# Patient Record
Sex: Male | Born: 1975 | Hispanic: Yes | Marital: Married | Smoking: Never smoker
Health system: Southern US, Community
[De-identification: ages and names within clinical notes are randomized; demographics above are authoritative.]

---

## 2020-10-05 ENCOUNTER — Encounter (HOSPITAL_COMMUNITY): Payer: Self-pay | Admitting: Emergency Medicine

## 2020-10-05 ENCOUNTER — Emergency Department (HOSPITAL_COMMUNITY): Payer: Self-pay

## 2020-10-05 ENCOUNTER — Other Ambulatory Visit: Payer: Self-pay

## 2020-10-05 ENCOUNTER — Inpatient Hospital Stay (HOSPITAL_COMMUNITY)
Admission: EM | Admit: 2020-10-05 | Discharge: 2020-10-07 | DRG: 853 | Disposition: A | Discharge status: Home / Self Care | Payer: Self-pay | Attending: General Surgery | Admitting: General Surgery

## 2020-10-05 DIAGNOSIS — E861 Hypovolemia: Secondary | ICD-10-CM | POA: Diagnosis present

## 2020-10-05 DIAGNOSIS — E871 Hypo-osmolality and hyponatremia: Secondary | ICD-10-CM | POA: Diagnosis present

## 2020-10-05 DIAGNOSIS — K3532 Acute appendicitis with perforation and localized peritonitis, without abscess: Secondary | ICD-10-CM | POA: Diagnosis present

## 2020-10-05 DIAGNOSIS — Z833 Family history of diabetes mellitus: Secondary | ICD-10-CM

## 2020-10-05 DIAGNOSIS — R7989 Other specified abnormal findings of blood chemistry: Secondary | ICD-10-CM

## 2020-10-05 DIAGNOSIS — Z20822 Contact with and (suspected) exposure to covid-19: Secondary | ICD-10-CM | POA: Diagnosis present

## 2020-10-05 DIAGNOSIS — K3580 Unspecified acute appendicitis: Secondary | ICD-10-CM | POA: Diagnosis present

## 2020-10-05 DIAGNOSIS — E0500 Thyrotoxicosis with diffuse goiter without thyrotoxic crisis or storm: Secondary | ICD-10-CM | POA: Diagnosis present

## 2020-10-05 DIAGNOSIS — E876 Hypokalemia: Secondary | ICD-10-CM | POA: Diagnosis present

## 2020-10-05 DIAGNOSIS — A419 Sepsis, unspecified organism: Principal | ICD-10-CM | POA: Diagnosis present

## 2020-10-05 LAB — COMPREHENSIVE METABOLIC PANEL
ALT: 33 U/L (ref 0–44)
AST: 30 U/L (ref 15–41)
Albumin: 4.5 g/dL (ref 3.5–5.0)
Alkaline Phosphatase: 163 U/L — ABNORMAL HIGH (ref 38–126)
Anion gap: 11 (ref 5–15)
BUN: 15 mg/dL (ref 6–20)
CO2: 25 mmol/L (ref 22–32)
Calcium: 9.1 mg/dL (ref 8.9–10.3)
Chloride: 98 mmol/L (ref 98–111)
Creatinine, Ser: 0.91 mg/dL (ref 0.61–1.24)
GFR, Estimated: >60 mL/min (ref >60)
Glucose, Bld: 152 mg/dL — ABNORMAL HIGH (ref 70–99)
Potassium: 3.4 mmol/L — ABNORMAL LOW (ref 3.5–5.1)
Sodium: 134 mmol/L — ABNORMAL LOW (ref 135–145)
Total Bilirubin: 1.8 mg/dL — ABNORMAL HIGH (ref 0.3–1.2)
Total Protein: 8.1 g/dL (ref 6.5–8.1)

## 2020-10-05 LAB — CBC WITH DIFFERENTIAL/PLATELET
Abs Immature Granulocytes: 0.07 10*3/uL (ref 0.00–0.07)
Basophils Absolute: 0.1 10*3/uL (ref 0.0–0.1)
Basophils Relative: 0 %
Eosinophils Absolute: 0 10*3/uL (ref 0.0–0.5)
Eosinophils Relative: 0 %
HCT: 47.4 % (ref 39.0–52.0)
Hemoglobin: 16.9 g/dL (ref 13.0–17.0)
Immature Granulocytes: 0 %
Lymphocytes Relative: 5 %
Lymphs Abs: 1 10*3/uL (ref 0.7–4.0)
MCH: 32.1 pg (ref 26.0–34.0)
MCHC: 35.7 g/dL (ref 30.0–36.0)
MCV: 89.9 fL (ref 80.0–100.0)
Monocytes Absolute: 1.1 10*3/uL — ABNORMAL HIGH (ref 0.1–1.0)
Monocytes Relative: 6 %
Neutro Abs: 17 10*3/uL — ABNORMAL HIGH (ref 1.7–7.7)
Neutrophils Relative %: 89 %
Platelets: 237 10*3/uL (ref 150–400)
RBC: 5.27 MIL/uL (ref 4.22–5.81)
RDW: 12.1 % (ref 11.5–15.5)
WBC: 19.4 10*3/uL — ABNORMAL HIGH (ref 4.0–10.5)
nRBC: 0 % (ref 0.0–0.2)

## 2020-10-05 LAB — RESP PANEL BY RT-PCR (FLU A&B, COVID) ARPGX2
Influenza A by PCR: NEGATIVE
Influenza B by PCR: NEGATIVE
SARS Coronavirus 2 by RT PCR: NEGATIVE

## 2020-10-05 LAB — LIPASE, BLOOD: Lipase: 28 U/L (ref 11–51)

## 2020-10-05 MED ORDER — ONDANSETRON HCL 4 MG/2ML IJ SOLN: 4.0000 mg | Freq: Four times a day (QID) | INTRAMUSCULAR | Status: DC | PRN

## 2020-10-05 MED ORDER — TRAZODONE HCL 50 MG PO TABS: 25.0000 mg | ORAL_TABLET | Freq: Every evening | ORAL | Status: DC | PRN

## 2020-10-05 MED ORDER — LORAZEPAM 2 MG/ML IJ SOLN: 1.0000 mg | INTRAMUSCULAR | Status: DC | PRN

## 2020-10-05 MED ORDER — ACETAMINOPHEN 325 MG PO TABS
650.0000 mg | ORAL_TABLET | Freq: Four times a day (QID) | ORAL | Status: DC | PRN
  Administered 2020-10-07: 650 mg via ORAL
  Filled 2020-10-05: qty 2

## 2020-10-05 MED ORDER — POTASSIUM CHLORIDE 20 MEQ PO PACK
40.0000 meq | PACK | Freq: Once | ORAL | Status: AC
  Administered 2020-10-05: 40 meq via ORAL
  Filled 2020-10-05: qty 2

## 2020-10-05 MED ORDER — HYDROMORPHONE HCL 1 MG/ML IJ SOLN
1.0000 mg | INTRAMUSCULAR | Status: DC | PRN
  Administered 2020-10-06 (×4): 1 mg via INTRAVENOUS
  Filled 2020-10-05 (×4): qty 1

## 2020-10-05 MED ORDER — ACETAMINOPHEN 325 MG PO TABS
650.0000 mg | ORAL_TABLET | Freq: Once | ORAL | Status: AC
  Administered 2020-10-05: 650 mg via ORAL
  Filled 2020-10-05: qty 2

## 2020-10-05 MED ORDER — ONDANSETRON HCL 4 MG/2ML IJ SOLN
4.0000 mg | Freq: Once | INTRAMUSCULAR | Status: AC
  Administered 2020-10-05: 4 mg via INTRAVENOUS
  Filled 2020-10-05: qty 2

## 2020-10-05 MED ORDER — SODIUM CHLORIDE 0.9 % IV BOLUS
1000.0000 mL | Freq: Once | INTRAVENOUS | Status: AC
  Administered 2020-10-05: 1000 mL via INTRAVENOUS

## 2020-10-05 MED ORDER — IOHEXOL 300 MG/ML  SOLN
100.0000 mL | Freq: Once | INTRAMUSCULAR | Status: AC | PRN
  Administered 2020-10-05: 100 mL via INTRAVENOUS

## 2020-10-05 MED ORDER — HYDROMORPHONE HCL 1 MG/ML IJ SOLN
1.0000 mg | Freq: Once | INTRAMUSCULAR | Status: AC
  Administered 2020-10-05: 1 mg via INTRAVENOUS
  Filled 2020-10-05: qty 1

## 2020-10-05 MED ORDER — ONDANSETRON HCL 4 MG PO TABS: 4.0000 mg | ORAL_TABLET | Freq: Four times a day (QID) | ORAL | Status: DC | PRN

## 2020-10-05 MED ORDER — MORPHINE SULFATE (PF) 2 MG/ML IV SOLN
2.0000 mg | INTRAVENOUS | Status: DC | PRN
  Administered 2020-10-05: 2 mg via INTRAVENOUS
  Filled 2020-10-05: qty 1

## 2020-10-05 MED ORDER — ACETAMINOPHEN 650 MG RE SUPP: 650.0000 mg | Freq: Four times a day (QID) | RECTAL | Status: DC | PRN

## 2020-10-05 MED ORDER — POTASSIUM CHLORIDE IN NACL 20-0.9 MEQ/L-% IV SOLN: INTRAVENOUS | Status: DC

## 2020-10-05 MED ORDER — MORPHINE SULFATE (PF) 4 MG/ML IV SOLN
4.0000 mg | Freq: Once | INTRAVENOUS | Status: AC
  Administered 2020-10-05: 4 mg via INTRAVENOUS
  Filled 2020-10-05: qty 1

## 2020-10-05 MED ORDER — PIPERACILLIN-TAZOBACTAM 3.375 G IVPB
3.3750 g | Freq: Three times a day (TID) | INTRAVENOUS | Status: DC
  Administered 2020-10-06 – 2020-10-07 (×3): 3.375 g via INTRAVENOUS
  Filled 2020-10-05 (×3): qty 50

## 2020-10-05 MED ORDER — ENOXAPARIN SODIUM 40 MG/0.4ML IJ SOSY
40.0000 mg | PREFILLED_SYRINGE | INTRAMUSCULAR | Status: DC
  Administered 2020-10-05 – 2020-10-06 (×2): 40 mg via SUBCUTANEOUS
  Filled 2020-10-05 (×2): qty 0.4

## 2020-10-05 MED ORDER — MAGNESIUM HYDROXIDE 400 MG/5ML PO SUSP: 30.0000 mL | Freq: Every day | ORAL | Status: DC | PRN

## 2020-10-05 MED ORDER — PIPERACILLIN-TAZOBACTAM 3.375 G IVPB 30 MIN: 3.3750 g | Freq: Four times a day (QID) | INTRAVENOUS | Status: DC

## 2020-10-05 MED ORDER — PIPERACILLIN-TAZOBACTAM 3.375 G IVPB 30 MIN
3.3750 g | Freq: Once | INTRAVENOUS | Status: AC
  Administered 2020-10-05: 3.375 g via INTRAVENOUS
  Filled 2020-10-05: qty 50

## 2020-10-05 NOTE — H&P (Signed)
[place]   PATIENT NAME: Omar [name]    MR#:  [id number]  DATE OF BIRTH:  [birth year]  DATE OF ADMISSION:  10/05/2020  PRIMARY CARE PHYSICIAN: Pcp, No   Patient is coming from: Home  REQUESTING/REFERRING PHYSICIAN: Kem Parkinson, PA-C  CHIEF COMPLAINT:   Chief Complaint  Patient presents with   Abdominal Pain   The patient is mainly Spanish-speaking.  His son was providing translation. HISTORY OF PRESENT ILLNESS:  Omar [name] is a 45 y.o. [name] with no significant medical history, presenting to the emergency room with acute onset of lower abdominal pain which started 2 days ago after eating beef taco.  He was seen in [facility] urgent care initially and was thought to have food poisoning.  He was given a prescription for Bentyl.  His pain has been worse today.  He had associated diaphoresis with it as well as nausea without vomiting.  His pain has been constant with no radiation.  This last bowel movement was yesterday morning and it was dark green but formed.  He denies any fever or chills at home however he was having low-grade fever here in the ER.  No bright red bleeding per rectum or melena.  He denies any dyspnea or cough or wheezing.  No chest pain or palpitations.  ED Course: Upon presentation to the emergency room temperature was 99.2 and later 100, blood pressure was 123/107 and later 137/93 with otherwise normal vital signs.  Labs revealed mild hypokalemia and hyponatremia and a glucose of 152.  Alk phos was 163 and total bili 1.8 with otherwise unremarkable LFTs.  CBC showed significant leukocytosis of 19.4 with neutrophilia.  Influenza antigens and COVID-19 PCR came back negative.  Imaging: Abdominal pelvic CT scan revealed the following: Acute appendicitis with possible perforation. Right lower quadrant pelvic free fluid with 2 areas of fluid which appear more organized than with expected in the right lower quadrant and pelvis,  possibly representing developing phlegmonous collections. No pneumoperitoneum.  Dr. Arnoldo Morale was notified about the patient and reviewed his abdominal CT scan pictures.  He believes that the patient can wait till the morning for operative intervention and recommended IV Zosyn.  The patient will be admitted to telemetry medical bed for further evaluation and management.  PAST MEDICAL HISTORY:  History reviewed. No pertinent past medical history.  He denies any medical problems.  PAST SURGICAL HISTORY:  History reviewed. No pertinent surgical history.  He denies any previous surgeries  SOCIAL HISTORY:   Social History   Tobacco Use   Smoking status: Unknown   Smokeless tobacco: Not on file  Substance Use Topics   Alcohol use: Not Currently  He drinks alcohol occasionally.  No history of tobacco use or illicit drug use.  FAMILY HISTORY:  Positive for diabetes mellitus in his mother.  DRUG ALLERGIES:  No Known Allergies  REVIEW OF SYSTEMS:   ROS As per history of present illness. All pertinent systems were reviewed above. Constitutional, HEENT, cardiovascular, respiratory, GI, GU, musculoskeletal, neuro, psychiatric, endocrine, integumentary and hematologic systems were reviewed and are otherwise negative/unremarkable except for positive findings mentioned above in the HPI.   MEDICATIONS AT HOME:   Prior to Admission medications   Not on File      VITAL SIGNS:  Blood pressure (!) 137/93, pulse 73, temperature 100 F (37.8 C), temperature source Oral, resp. rate 19, height $RemoveBe'5\' 9"'AtjhvGFmp$  (1.753 m), weight 81.6 kg, SpO2 97 %.  PHYSICAL EXAMINATION:  Physical Exam  GENERAL:  45 y.o.-year-old [name] patient lying in the bed with no acute distress.  EYES: Pupils equal, round, reactive to light and accommodation. No scleral icterus. Extraocular muscles intact.  HEENT: Head atraumatic, normocephalic. Oropharynx and nasopharynx clear.  NECK:  Supple, no jugular venous distention.  No thyroid enlargement, no tenderness.  LUNGS: Normal breath sounds bilaterally, no wheezing, rales,rhonchi or crepitation. No use of accessory muscles of respiration.  CARDIOVASCULAR: Regular rate and rhythm, heart rate was up to 92.  S1, S2 normal. No murmurs, rubs, or gallops.  ABDOMEN: Soft, nondistended, with right lower quadrant tenderness with rebound tenderness, mild guarding or rigidity.  He had positive peritoneal signs.  Bowel sounds present. No organomegaly or mass.  EXTREMITIES: No pedal edema, cyanosis, or clubbing.  NEUROLOGIC: Cranial nerves II through XII are intact. Muscle strength 5/5 in all extremities. Sensation intact. Gait not checked.  PSYCHIATRIC: The patient is alert and oriented x 3.  Normal affect and good eye contact. SKIN: No obvious rash, lesion, or ulcer.   LABORATORY PANEL:   CBC Recent Labs  Lab 10/05/20 1633  WBC 19.4*  HGB 16.9  HCT 47.4  PLT 237   ------------------------------------------------------------------------------------------------------------------  Chemistries  Recent Labs  Lab 10/05/20 1633  NA 134*  K 3.4*  CL 98  CO2 25  GLUCOSE 152*  BUN 15  CREATININE 0.91  CALCIUM 9.1  AST 30  ALT 33  ALKPHOS 163*  BILITOT 1.8*   ------------------------------------------------------------------------------------------------------------------  Cardiac Enzymes No results for input(s): TROPONINI in the last 168 hours. ------------------------------------------------------------------------------------------------------------------  RADIOLOGY:  CT ABDOMEN PELVIS W CONTRAST  Result Date: 10/05/2020 CLINICAL DATA:  Right lower quadrant abdominal pain and nausea x4 days. EXAM: CT ABDOMEN AND PELVIS WITH CONTRAST TECHNIQUE: Multidetector CT imaging of the abdomen and pelvis was performed using the standard protocol following bolus administration of intravenous contrast. CONTRAST:  OMNIPAQUE IOHEXOL 300 MG/ML  SOLN COMPARISON:   None. FINDINGS: Lower chest: No acute abnormality. Normal size heart. No significant pericardial effusion/thickening. Hepatobiliary: No suspicious hepatic lesion. Gallbladder is unremarkable. No biliary ductal dilation. Pancreas: Within normal limits. Spleen: Within normal limits. Adrenals/Urinary Tract: Adrenal glands are unremarkable. Kidneys are normal, without renal calculi, solid enhancing lesion, or hydronephrosis. Bladder is unremarkable for degree of distension. Stomach/Bowel: Small hiatal hernia otherwise stomach is grossly unremarkable. No pathologic dilation of small bowel. Descending and sigmoid colon are predominantly decompressed limiting evaluation. Small volume of formed stool in the ascending colon with gaseous distension of the transverse colon. Acute inflammation of a fluid-filled enlarged appendix with a 6 mm appendicoliths. Frayed appearance of the tip of the appendix with right lower quadrant free fluid. Vascular/Lymphatic: No abdominal aortic aneurysm. Prominent right lower quadrant lymph nodes without adenopathy by size criteria. No pathologically enlarged abdominal or pelvic lymph nodes. Reproductive: Mild prostatic enlargement. Other: Mesenteric edema with free fluid in the right lower quadrant and pelvis. There are 2 areas of fluid which appear more organized than with expected for normal free fluid for instance along the cecum measuring 3.4 cm on image 64/2 and along a loop of ileum in the right hemipelvis measuring 2.2 cm on image 75/2, possibly representing organizing collections. No pneumoperitoneum. Musculoskeletal: No acute osseous abnormality. IMPRESSION: Acute appendicitis with possible perforation. Right lower quadrant pelvic free fluid with 2 areas of fluid which appear more organized than with expected in the right lower quadrant and pelvis, possibly representing developing phlegmonous collections. No pneumoperitoneum. Electronically Signed   By: Maudry Mayhew MD   On:  10/05/2020 19:53      IMPRESSION AND PLAN:  Active Problems:   Acute appendicitis  1.  Acute appendicitis with suspected perforation and possible phlegmonous collections.  The patient had minimal increase in his heart rate to 92 and given his significant leukocytosis he would meet criteria for sepsis. - The patient will be admitted to a telemetry medical bed. - We will continue antibiotic therapy with IV Zosyn. - Pain management will be provided. - I discussed the case with Dr. Arnoldo Morale given the fact that is meeting sepsis criteria preferred to have the patient's evaluated tonight from surgical standpoint.  Dr. Arnoldo Morale will graciously assess the patient in the ER. - We will continue hydration with IV normal saline keep the patient NPO.  2.  Mild hypokalemia. - He was ordered p.o. potassium chloride with a sip of water and will replace it with a IV fluids. - We will check magnesium level.  3.  Mild hyponatremia. - This is likely secondary to hypovolemia. - The patient will be hydrated with IV normal saline with added potassium chloride and will follow his BMP.  4.  Elevated alk phos and total bili. - We will repeat LFTs with hydration.  DVT prophylaxis: Lovenox. Code Status: full code. Family Communication:  The plan of care was discussed in details with the patient (and family). I answered all questions. The patient agreed to proceed with the above mentioned plan. Further management will depend upon hospital course. Disposition Plan: Back to previous home environment Consults called: General surgery. All the records are reviewed and case discussed with ED provider.  Status is: Inpatient  Remains inpatient appropriate because:Ongoing active pain requiring inpatient pain management, Ongoing diagnostic testing needed not appropriate for outpatient work up, Unsafe d/c plan, IV treatments appropriate due to intensity of illness or inability to take PO, and Inpatient level of care  appropriate due to severity of illness  Dispo: The patient is from: Home              Anticipated d/c is to: Home              Patient currently is not medically stable to d/c.   Difficult to place patient No  TOTAL TIME TAKING CARE OF THIS PATIENT: 55 minutes.    Christel Mormon M.D on 10/05/2020 at 9:25 PM  [facility]   From 7 PM-7 AM, contact night-coverage [email or web address]  CC: Primary care physician; Pcp, No

## 2020-10-05 NOTE — Consult Note (Signed)
Reason for Consult: Acute appendicitis with probable perforation Referring Physician: Dr. Legrand Omar Omar [name] is an 45 y.o. male.  HPI: Patient is [name] who initially went to [facility] urgent care yesterday for abdominal pain.  He initially was diagnosed with food poisoning and started on Bentyl.  He presented today with worsening lower abdominal pain, nausea.  No vomiting was noted.  CT scan of the abdomen reveals acute appendicitis with phlegmon formation and organizing fluid in the right lower quadrant consistent with possible perforation.  No free air is noted.  He was noted to have a leukocytosis of 19,000.  He was admitted by the hospitalist for further evaluation and treatment.  Please see H&P.  History reviewed. No pertinent past medical history.  History reviewed. No pertinent surgical history.  History reviewed. No pertinent family history.  Social History:  reports previous alcohol use. He reports that he does not use drugs. No history on file for tobacco use.  Allergies: No Known Allergies  Medications: I have reviewed the patient's current medications.  Results for orders placed or performed during the hospital encounter of 10/05/20 (from the past 48 hour(s))  Resp Panel by RT-PCR (Flu A&B, Covid) Nasopharyngeal Swab     Status: None   Collection Time: 10/05/20  4:17 PM   Specimen: Nasopharyngeal Swab; Nasopharyngeal(NP) swabs in vial transport medium  Result Value Ref Range   SARS Coronavirus 2 by RT PCR NEGATIVE NEGATIVE    Comment: (NOTE) SARS-CoV-2 target nucleic acids are NOT DETECTED.  The SARS-CoV-2 RNA is generally detectable in upper respiratory specimens during the acute phase of infection. The lowest concentration of SARS-CoV-2 viral copies this assay can detect is 138 copies/mL. A negative result does not preclude SARS-Cov-2 infection and should not be used as the sole basis for treatment or other patient management decisions. A  negative result may occur with  improper specimen collection/handling, submission of specimen other than nasopharyngeal swab, presence of viral mutation(s) within the areas targeted by this assay, and inadequate number of viral copies(<138 copies/mL). A negative result must be combined with clinical observations, patient history, and epidemiological information. The expected result is Negative.  Fact Sheet for Patients:  BloggerCourse.com  Fact Sheet for Healthcare Providers:  SeriousBroker.it  This test is no t yet approved or cleared by the [place] FDA and  has been authorized for detection and/or diagnosis of SARS-CoV-2 by FDA under an Emergency Use Authorization (EUA). This EUA will remain  in effect (meaning this test can be used) for the duration of the COVID-19 declaration under Section 564(b)(1) of the Act, 21 U.S.C.section 360bbb-3(b)(1), unless the authorization is terminated  or revoked sooner.       Influenza A by PCR NEGATIVE NEGATIVE   Influenza B by PCR NEGATIVE NEGATIVE    Comment: (NOTE) The Xpert Xpress SARS-CoV-2/FLU/RSV plus assay is intended as an aid in the diagnosis of influenza from Nasopharyngeal swab specimens and should not be used as a sole basis for treatment. Nasal washings and aspirates are unacceptable for Xpert Xpress SARS-CoV-2/FLU/RSV testing.  Fact Sheet for Patients: BloggerCourse.com  Fact Sheet for Healthcare Providers: SeriousBroker.it  This test is not yet approved or cleared by the [place] FDA and has been authorized for detection and/or diagnosis of SARS-CoV-2 by FDA under an Emergency Use Authorization (EUA). This EUA will remain in effect (meaning this test can be used) for the duration of the COVID-19 declaration under Section 564(b)(1) of the Act, 21 U.S.C. section 360bbb-3(b)(1),  unless the authorization is terminated  or revoked.  Performed at [facility], [address], [place], [place] [address]   Lipase, blood     Status: None   Collection Time: 10/05/20  4:33 PM  Result Value Ref Range   Lipase 28 11 - 51 U/L    Comment: Performed at [facility], [address], [place], [place] [address]  Comprehensive metabolic panel     Status: Abnormal   Collection Time: 10/05/20  4:33 PM  Result Value Ref Range   Sodium 134 (L) 135 - 145 mmol/L   Potassium 3.4 (L) 3.5 - 5.1 mmol/L   Chloride 98 98 - 111 mmol/L   CO2 25 22 - 32 mmol/L   Glucose, Bld 152 (H) 70 - 99 mg/dL    Comment: Glucose reference range applies only to samples taken after fasting for at least 8 hours.   BUN 15 6 - 20 mg/dL   Creatinine, Ser 6.28 0.61 - 1.24 mg/dL   Calcium 9.1 8.9 - 31.5 mg/dL   Total Protein 8.1 6.5 - 8.1 g/dL   Albumin 4.5 3.5 - 5.0 g/dL   AST 30 15 - 41 U/L   ALT 33 0 - 44 U/L   Alkaline Phosphatase 163 (H) 38 - 126 U/L   Total Bilirubin 1.8 (H) 0.3 - 1.2 mg/dL   GFR, Estimated >17 >61 mL/min    Comment: (NOTE) Calculated using the CKD-EPI Creatinine Equation (2021)    Anion gap 11 5 - 15    Comment: Performed at [facility], [address], [place], [place] [address]  CBC with Differential     Status: Abnormal   Collection Time: 10/05/20  4:33 PM  Result Value Ref Range   WBC 19.4 (H) 4.0 - 10.5 K/uL   RBC 5.27 4.22 - 5.81 MIL/uL   Hemoglobin 16.9 13.0 - 17.0 g/dL   HCT 10.6 26.9 - 48.5 %   MCV 89.9 80.0 - 100.0 fL   MCH 32.1 26.0 - 34.0 pg   MCHC 35.7 30.0 - 36.0 g/dL   RDW 46.2 70.3 - 50.0 %   Platelets 237 150 - 400 K/uL   nRBC 0.0 0.0 - 0.2 %   Neutrophils Relative % 89 %   Neutro Abs 17.0 (H) 1.7 - 7.7 K/uL   Lymphocytes Relative 5 %   Lymphs Abs 1.0 0.7 - 4.0 K/uL   Monocytes Relative 6 %   Monocytes Absolute 1.1 (H) 0.1 - 1.0 K/uL   Eosinophils Relative 0 %   Eosinophils Absolute 0.0 0.0 - 0.5 K/uL   Basophils Relative 0 %   Basophils Absolute 0.1 0.0 - 0.1 K/uL   Immature  Granulocytes 0 %   Abs Immature Granulocytes 0.07 0.00 - 0.07 K/uL    Comment: Performed at [facility], [address] [place], [place] [address]    CT ABDOMEN PELVIS W CONTRAST  Result Date: 10/05/2020 CLINICAL DATA:  Right lower quadrant abdominal pain and nausea x4 days. EXAM: CT ABDOMEN AND PELVIS WITH CONTRAST TECHNIQUE: Multidetector CT imaging of the abdomen and pelvis was performed using the standard protocol following bolus administration of intravenous contrast. CONTRAST:  OMNIPAQUE IOHEXOL 300 MG/ML  SOLN COMPARISON:  None. FINDINGS: Lower chest: No acute abnormality. Normal size heart. No significant pericardial effusion/thickening. Hepatobiliary: No suspicious hepatic lesion. Gallbladder is unremarkable. No biliary ductal dilation. Pancreas: Within normal limits. Spleen: Within normal limits. Adrenals/Urinary Tract: Adrenal glands are unremarkable. Kidneys are normal, without renal calculi, solid enhancing lesion, or  hydronephrosis. Bladder is unremarkable for degree of distension. Stomach/Bowel: Small hiatal hernia otherwise stomach is grossly unremarkable. No pathologic dilation of small bowel. Descending and sigmoid colon are predominantly decompressed limiting evaluation. Small volume of formed stool in the ascending colon with gaseous distension of the transverse colon. Acute inflammation of a fluid-filled enlarged appendix with a 6 mm appendicoliths. Frayed appearance of the tip of the appendix with right lower quadrant free fluid. Vascular/Lymphatic: No abdominal aortic aneurysm. Prominent right lower quadrant lymph nodes without adenopathy by size criteria. No pathologically enlarged abdominal or pelvic lymph nodes. Reproductive: Mild prostatic enlargement. Other: Mesenteric edema with free fluid in the right lower quadrant and pelvis. There are 2 areas of fluid which appear more organized than with expected for normal free fluid for instance along the cecum measuring 3.4 cm on  image 64/2 and along a loop of ileum in the right hemipelvis measuring 2.2 cm on image 75/2, possibly representing organizing collections. No pneumoperitoneum. Musculoskeletal: No acute osseous abnormality. IMPRESSION: Acute appendicitis with possible perforation. Right lower quadrant pelvic free fluid with 2 areas of fluid which appear more organized than with expected in the right lower quadrant and pelvis, possibly representing developing phlegmonous collections. No pneumoperitoneum. Electronically Signed   By: Maudry Mayhew MD   On: 10/05/2020 19:53    ROS:  Pertinent items are noted in HPI.  Blood pressure (!) 152/97, pulse 98, temperature (!) 101.1 F (38.4 C), temperature source Oral, resp. rate 18, height 5\' 9"  (1.753 m), weight 81.6 kg, SpO2 97 %. Physical Exam: Pleasant [name] in no acute distress Head is normocephalic, atraumatic Lungs clear to auscultation with good breath sounds bilaterally Heart examination reveals a regular rate and rhythm without S3, S4, murmurs Abdomen is soft with particular tenderness in the right lower quadrant and suprapubic region.  He is less tender in the left lower quadrant region.  He does not have a rigid abdomen. CT scan images personally reviewed  Assessment/Plan: Impression: Acute appendicitis with evidence of perforation Plan: Would continue IV Zosyn and fluid resuscitation.  As there is significant evidence of localized perforation on CAT scan, we will see how the patient progresses.  He may need laparoscopic appendectomy or interventional radiologic drainage depending on how he does progress.  Will transfer patient to my service.  10/05/2020, 11:02 PM

## 2020-10-05 NOTE — ED Triage Notes (Signed)
Pt c/o abd pain since Sunday. Seen at [facility] and prescribed bentyl and dx with food poisoning. Pt states today the pain is worse.

## 2020-10-05 NOTE — ED Provider Notes (Signed)
[facility] EMERGENCY DEPARTMENT Provider Note   CSN: [id number] Arrival date & time: 10/05/20  1536     History Chief Complaint  Patient presents with   Abdominal Pain    Omar [name] is a 45 y.o. male.   Abdominal Pain Associated symptoms: nausea   Associated symptoms: no chest pain, no chills, no cough, no diarrhea, no dysuria, no fatigue, no fever, no shortness of breath, no sore throat and no vomiting       Omar [name] is a 45 y.o. male who presents to the Emergency Department complaining of gradually worsening lower abdominal pain for 2 days.  He describes having mild pain to his lower abdomen on Sunday.  He was seen at [facility] urgent care and given a prescription for Bentyl and advised he may have food poisoning.  Pain worse today and now associated with nausea and significant sweats.  States pain is constant and nonradiating.  No vomiting or diarrhea.  No fever or chills.  No history of abdominal surgeries.  Last meal at noon today.   History reviewed. No pertinent past medical history.  There are no problems to display for this patient.   History reviewed. No pertinent surgical history.     No family history on file.  Social History   Tobacco Use   Smoking status: Unknown  Substance Use Topics   Alcohol use: Not Currently   Drug use: Never    Home Medications Prior to Admission medications   Not on File    Allergies    Patient has no known allergies.  Review of Systems   Review of Systems  Constitutional:  Positive for appetite change and diaphoresis. Negative for chills, fatigue and fever.  HENT:  Negative for congestion, sore throat and trouble swallowing.   Respiratory:  Negative for cough, shortness of breath and wheezing.   Cardiovascular:  Negative for chest pain and palpitations.  Gastrointestinal:  Positive for abdominal pain and nausea. Negative for abdominal distention, blood in stool, diarrhea and vomiting.  Genitourinary:   Negative for dysuria and flank pain.  Musculoskeletal:  Negative for arthralgias, back pain, myalgias, neck pain and neck stiffness.  Skin:  Negative for rash.  Neurological:  Negative for dizziness, weakness and numbness.  Hematological:  Does not bruise/bleed easily.   Physical Exam Updated Vital Signs BP (!) 123/107 (BP Location: Right Arm)   Pulse 81   Temp 99.2 F (37.3 C) (Oral)   Resp 20   Ht 5\' 9"  (1.753 m)   Wt 81.6 kg   SpO2 97%   BMI 26.58 kg/m   Physical Exam Vitals and nursing note reviewed.  Constitutional:      Appearance: He is ill-appearing and diaphoretic.     Comments: Patient is grimacing and holding his lower abdomen.  Very diaphoretic.  Uncomfortable appearing.  HENT:     Mouth/Throat:     Mouth: Mucous membranes are moist.  Cardiovascular:     Rate and Rhythm: Normal rate and regular rhythm.     Pulses: Normal pulses.  Pulmonary:     Effort: Pulmonary effort is normal.  Abdominal:     Palpations: Abdomen is soft.     Tenderness: There is abdominal tenderness.     Comments: Right lower quadrant tenderness to palpation.  No guarding or rebound tenderness.  No CVA tenderness.  Musculoskeletal:     Right lower leg: No edema.     Left lower leg: No edema.  Skin:    General: Skin  is warm.     Capillary Refill: Capillary refill takes less than 2 seconds.     Findings: No rash.  Neurological:     General: No focal deficit present.     Mental Status: He is alert.     Sensory: No sensory deficit.     Motor: No weakness.    ED Results / Procedures / Treatments   Labs (all labs ordered are listed, but only abnormal results are displayed) Labs Reviewed  COMPREHENSIVE METABOLIC PANEL - Abnormal; Notable for the following components:      Result Value   Sodium 134 (*)    Potassium 3.4 (*)    Glucose, Bld 152 (*)    Alkaline Phosphatase 163 (*)    Total Bilirubin 1.8 (*)    All other components within normal limits  CBC WITH DIFFERENTIAL/PLATELET -  Abnormal; Notable for the following components:   WBC 19.4 (*)    Neutro Abs 17.0 (*)    Monocytes Absolute 1.1 (*)    All other components within normal limits  RESP PANEL BY RT-PCR (FLU A&B, COVID) ARPGX2  LIPASE, BLOOD  URINALYSIS, ROUTINE W REFLEX MICROSCOPIC    EKG None  Radiology CT ABDOMEN PELVIS W CONTRAST  Result Date: 10/05/2020 CLINICAL DATA:  Right lower quadrant abdominal pain and nausea x4 days. EXAM: CT ABDOMEN AND PELVIS WITH CONTRAST TECHNIQUE: Multidetector CT imaging of the abdomen and pelvis was performed using the standard protocol following bolus administration of intravenous contrast. CONTRAST:  OMNIPAQUE IOHEXOL 300 MG/ML  SOLN COMPARISON:  None. FINDINGS: Lower chest: No acute abnormality. Normal size heart. No significant pericardial effusion/thickening. Hepatobiliary: No suspicious hepatic lesion. Gallbladder is unremarkable. No biliary ductal dilation. Pancreas: Within normal limits. Spleen: Within normal limits. Adrenals/Urinary Tract: Adrenal glands are unremarkable. Kidneys are normal, without renal calculi, solid enhancing lesion, or hydronephrosis. Bladder is unremarkable for degree of distension. Stomach/Bowel: Small hiatal hernia otherwise stomach is grossly unremarkable. No pathologic dilation of small bowel. Descending and sigmoid colon are predominantly decompressed limiting evaluation. Small volume of formed stool in the ascending colon with gaseous distension of the transverse colon. Acute inflammation of a fluid-filled enlarged appendix with a 6 mm appendicoliths. Frayed appearance of the tip of the appendix with right lower quadrant free fluid. Vascular/Lymphatic: No abdominal aortic aneurysm. Prominent right lower quadrant lymph nodes without adenopathy by size criteria. No pathologically enlarged abdominal or pelvic lymph nodes. Reproductive: Mild prostatic enlargement. Other: Mesenteric edema with free fluid in the right lower quadrant and pelvis.  There are 2 areas of fluid which appear more organized than with expected for normal free fluid for instance along the cecum measuring 3.4 cm on image 64/2 and along a loop of ileum in the right hemipelvis measuring 2.2 cm on image 75/2, possibly representing organizing collections. No pneumoperitoneum. Musculoskeletal: No acute osseous abnormality. IMPRESSION: Acute appendicitis with possible perforation. Right lower quadrant pelvic free fluid with 2 areas of fluid which appear more organized than with expected in the right lower quadrant and pelvis, possibly representing developing phlegmonous collections. No pneumoperitoneum. Electronically Signed   By: Maudry Mayhew MD   On: 10/05/2020 19:53    Procedures Procedures   Medications Ordered in ED Medications - No data to display  ED Course  I have reviewed the triage vital signs and the nursing notes.  Pertinent labs & imaging results that were available during my care of the patient were reviewed by me and considered in my medical decision making (see chart for  details).    MDM Rules/Calculators/A&P                          Patient here with right lower abdominal pain x2 days.  Seen at urgent care at onset for possible food poisoning.  Was given prescription for Bentyl.  Today pain became worse and now associated with nausea and sweats.  Last meal at noon today.  On exam, patient is uncomfortable appearing.  Grimacing and holding his lower abdomen on my initial exam.  At this point, I am concerned about acute appendicitis.  Will obtain labs, urinalysis and CT abdomen and pelvis.  Labs interpreted by me, significant leukocytosis with white count of 19,000.  Electrolytes show elevated alkaline phos and total bili of 1.8.  Remaining chemistries unremarkable.  COVID testing negative. 20:10 CT abdomen pelvis shows acute appendicitis with possible perforation.  On recheck, abdomen firm and pain more localized to RLQ.  Will consult general  surgery.  2030 discussed findings with surgeon Dr. Lovell Sheehan.  After his review of CT findings, it is felt that patient is likely has perforated his appendix.  He requests patient be admitted to hospitalist service and he will see pt in the morning and patient to be n.p.o. after midnight.  Discussed findings with [facility] hospitalist, Dr. Arville Care who agrees to admit   Final Clinical Impression(s) / ED Diagnoses Final diagnoses:  Acute appendicitis with perforation and localized peritonitis, without abscess or gangrene    Rx / DC Orders ED Discharge Orders     None        Pauline Aus, PA-C 10/05/20 2121    Derwood Kaplan, MD 10/06/20 0036

## 2020-10-06 ENCOUNTER — Inpatient Hospital Stay (HOSPITAL_COMMUNITY): Payer: Self-pay | Admitting: Certified Registered Nurse Anesthetist

## 2020-10-06 ENCOUNTER — Encounter (HOSPITAL_COMMUNITY): Payer: Self-pay | Admitting: Family Medicine

## 2020-10-06 ENCOUNTER — Encounter (HOSPITAL_COMMUNITY)
Admission: EM | Disposition: A | Discharge status: Home / Self Care | Payer: Self-pay | Source: Home / Self Care | Attending: General Surgery

## 2020-10-06 DIAGNOSIS — K3533 Acute appendicitis with perforation and localized peritonitis, with abscess: Secondary | ICD-10-CM

## 2020-10-06 HISTORY — PX: LAPAROSCOPIC APPENDECTOMY: SHX408

## 2020-10-06 LAB — BASIC METABOLIC PANEL
Anion gap: 11 (ref 5–15)
BUN: 17 mg/dL (ref 6–20)
CO2: 24 mmol/L (ref 22–32)
Calcium: 8.7 mg/dL — ABNORMAL LOW (ref 8.9–10.3)
Chloride: 100 mmol/L (ref 98–111)
Creatinine, Ser: 0.92 mg/dL (ref 0.61–1.24)
GFR, Estimated: >60 mL/min (ref >60)
Glucose, Bld: 150 mg/dL — ABNORMAL HIGH (ref 70–99)
Potassium: 4.7 mmol/L (ref 3.5–5.1)
Sodium: 135 mmol/L (ref 135–145)

## 2020-10-06 LAB — CBC
HCT: 46.3 % (ref 39.0–52.0)
Hemoglobin: 15.9 g/dL (ref 13.0–17.0)
MCH: 31.4 pg (ref 26.0–34.0)
MCHC: 34.3 g/dL (ref 30.0–36.0)
MCV: 91.3 fL (ref 80.0–100.0)
Platelets: 224 10*3/uL (ref 150–400)
RBC: 5.07 MIL/uL (ref 4.22–5.81)
RDW: 12.2 % (ref 11.5–15.5)
WBC: 18.6 10*3/uL — ABNORMAL HIGH (ref 4.0–10.5)
nRBC: 0 % (ref 0.0–0.2)

## 2020-10-06 LAB — HIV ANTIBODY (ROUTINE TESTING W REFLEX): HIV Screen 4th Generation wRfx: NONREACTIVE

## 2020-10-06 SURGERY — APPENDECTOMY, LAPAROSCOPIC
Anesthesia: General | Site: Abdomen

## 2020-10-06 MED ORDER — FENTANYL CITRATE (PF) 250 MCG/5ML IJ SOLN
INTRAMUSCULAR | Status: AC
  Filled 2020-10-06: qty 5

## 2020-10-06 MED ORDER — ONDANSETRON HCL 4 MG/2ML IJ SOLN
INTRAMUSCULAR | Status: DC | PRN
  Administered 2020-10-06: 4 mg via INTRAVENOUS

## 2020-10-06 MED ORDER — LIDOCAINE HCL (CARDIAC) PF 100 MG/5ML IV SOSY
PREFILLED_SYRINGE | INTRAVENOUS | Status: DC | PRN
  Administered 2020-10-06: 80 mg via INTRATRACHEAL

## 2020-10-06 MED ORDER — MIDAZOLAM HCL 2 MG/2ML IJ SOLN
INTRAMUSCULAR | Status: AC
  Filled 2020-10-06: qty 2

## 2020-10-06 MED ORDER — FENTANYL CITRATE (PF) 100 MCG/2ML IJ SOLN: 25.0000 ug | INTRAMUSCULAR | Status: DC | PRN

## 2020-10-06 MED ORDER — ONDANSETRON HCL 4 MG/2ML IJ SOLN
INTRAMUSCULAR | Status: AC
  Filled 2020-10-06: qty 2

## 2020-10-06 MED ORDER — KETOROLAC TROMETHAMINE 30 MG/ML IJ SOLN
30.0000 mg | Freq: Four times a day (QID) | INTRAMUSCULAR | Status: DC | PRN
  Administered 2020-10-06: 30 mg via INTRAVENOUS
  Filled 2020-10-06: qty 1

## 2020-10-06 MED ORDER — CHLORHEXIDINE GLUCONATE CLOTH 2 % EX PADS
6.0000 | MEDICATED_PAD | Freq: Once | CUTANEOUS | Status: AC
  Administered 2020-10-06: 6 via TOPICAL

## 2020-10-06 MED ORDER — ROCURONIUM BROMIDE 10 MG/ML (PF) SYRINGE
PREFILLED_SYRINGE | INTRAVENOUS | Status: DC | PRN
  Administered 2020-10-06: 20 mg via INTRAVENOUS
  Administered 2020-10-06: 50 mg via INTRAVENOUS

## 2020-10-06 MED ORDER — METHIMAZOLE 5 MG PO TABS
10.0000 mg | ORAL_TABLET | Freq: Every day | ORAL | Status: DC
  Administered 2020-10-07: 10 mg via ORAL
  Filled 2020-10-06: qty 2
  Filled 2020-10-06 (×2): qty 1

## 2020-10-06 MED ORDER — ORAL CARE MOUTH RINSE: 15.0000 mL | Freq: Once | OROMUCOSAL | Status: AC

## 2020-10-06 MED ORDER — SUGAMMADEX SODIUM 500 MG/5ML IV SOLN
INTRAVENOUS | Status: DC | PRN
  Administered 2020-10-06: 200 mg via INTRAVENOUS

## 2020-10-06 MED ORDER — BUPIVACAINE LIPOSOME 1.3 % IJ SUSP
INTRAMUSCULAR | Status: AC
  Filled 2020-10-06: qty 20

## 2020-10-06 MED ORDER — PROPOFOL 10 MG/ML IV BOLUS
INTRAVENOUS | Status: DC | PRN
  Administered 2020-10-06: 200 mg via INTRAVENOUS

## 2020-10-06 MED ORDER — ONDANSETRON HCL 4 MG/2ML IJ SOLN: 4.0000 mg | Freq: Once | INTRAMUSCULAR | Status: DC | PRN

## 2020-10-06 MED ORDER — BUPIVACAINE LIPOSOME 1.3 % IJ SUSP
INTRAMUSCULAR | Status: DC | PRN
  Administered 2020-10-06: 20 mL

## 2020-10-06 MED ORDER — LIDOCAINE HCL (PF) 2 % IJ SOLN
INTRAMUSCULAR | Status: AC
  Filled 2020-10-06: qty 5

## 2020-10-06 MED ORDER — MIDAZOLAM HCL 2 MG/2ML IJ SOLN
INTRAMUSCULAR | Status: DC | PRN
  Administered 2020-10-06: 2 mg via INTRAVENOUS

## 2020-10-06 MED ORDER — SODIUM CHLORIDE 0.9 % IR SOLN
Status: DC | PRN
  Administered 2020-10-06: 1000 mL
  Administered 2020-10-06: 3000 mL

## 2020-10-06 MED ORDER — CHLORHEXIDINE GLUCONATE 0.12 % MT SOLN
15.0000 mL | Freq: Once | OROMUCOSAL | Status: AC
  Administered 2020-10-06: 15 mL via OROMUCOSAL
  Filled 2020-10-06: qty 15

## 2020-10-06 MED ORDER — SODIUM CHLORIDE 0.9 % IV SOLN: INTRAVENOUS | Status: DC

## 2020-10-06 MED ORDER — ROCURONIUM BROMIDE 10 MG/ML (PF) SYRINGE
PREFILLED_SYRINGE | INTRAVENOUS | Status: AC
  Filled 2020-10-06: qty 10

## 2020-10-06 MED ORDER — KETOROLAC TROMETHAMINE 30 MG/ML IJ SOLN
30.0000 mg | Freq: Four times a day (QID) | INTRAMUSCULAR | Status: AC
  Administered 2020-10-06: 30 mg via INTRAVENOUS
  Filled 2020-10-06: qty 1

## 2020-10-06 MED ORDER — FENTANYL CITRATE (PF) 100 MCG/2ML IJ SOLN
INTRAMUSCULAR | Status: DC | PRN
  Administered 2020-10-06: 100 ug via INTRAVENOUS
  Administered 2020-10-06 (×2): 50 ug via INTRAVENOUS

## 2020-10-06 MED ORDER — LACTATED RINGERS IV SOLN: INTRAVENOUS | Status: DC

## 2020-10-06 SURGICAL SUPPLY — 51 items
APPLICATOR COTTON TIP 6 STRL (MISCELLANEOUS) IMPLANT
APPLICATOR COTTON TIP 6IN STRL (MISCELLANEOUS) ×2 IMPLANT
BAG RETRIEVAL 10 (BASKET) ×1
CHLORAPREP W/TINT 26 (MISCELLANEOUS) ×2 IMPLANT
CLOTH BEACON ORANGE TIMEOUT ST (SAFETY) ×2 IMPLANT
COVER LIGHT HANDLE STERIS (MISCELLANEOUS) ×4 IMPLANT
CUTTER FLEX LINEAR 45M (STAPLE) ×2 IMPLANT
DERMABOND ADVANCED (GAUZE/BANDAGES/DRESSINGS) ×1
DERMABOND ADVANCED .7 DNX12 (GAUZE/BANDAGES/DRESSINGS) ×1 IMPLANT
ELECT REM PT RETURN 9FT ADLT (ELECTROSURGICAL) ×2
ELECTRODE REM PT RTRN 9FT ADLT (ELECTROSURGICAL) ×1 IMPLANT
GAUZE 4X4 16PLY ~~LOC~~+RFID DBL (SPONGE) ×1 IMPLANT
GLOVE SRG 8 PF TXTR STRL LF DI (GLOVE) IMPLANT
GLOVE SURG POLYISO LF SZ8 (GLOVE) ×1 IMPLANT
GLOVE SURG SS PI 7.5 STRL IVOR (GLOVE) ×2 IMPLANT
GLOVE SURG UNDER POLY LF SZ7 (GLOVE) ×6 IMPLANT
GLOVE SURG UNDER POLY LF SZ8 (GLOVE) ×1
GOWN STRL REUS W/TWL LRG LVL3 (GOWN DISPOSABLE) ×4 IMPLANT
GOWN STRL REUS W/TWL XL LVL3 (GOWN DISPOSABLE) ×1 IMPLANT
INST SET LAPROSCOPIC AP (KITS) ×2 IMPLANT
IV NS IRRIG 3000ML ARTHROMATIC (IV SOLUTION) ×1 IMPLANT
KIT TURNOVER KIT A (KITS) ×2 IMPLANT
MANIFOLD NEPTUNE II (INSTRUMENTS) ×2 IMPLANT
NDL HYPO 18GX1.5 BLUNT FILL (NEEDLE) ×1 IMPLANT
NDL HYPO 21X1.5 SAFETY (NEEDLE) ×1 IMPLANT
NDL INSUFFLATION 14GA 120MM (NEEDLE) ×1 IMPLANT
NEEDLE HYPO 18GX1.5 BLUNT FILL (NEEDLE) ×2 IMPLANT
NEEDLE HYPO 21X1.5 SAFETY (NEEDLE) ×2 IMPLANT
NEEDLE INSUFFLATION 14GA 120MM (NEEDLE) ×2 IMPLANT
NS IRRIG 1000ML POUR BTL (IV SOLUTION) ×2 IMPLANT
PACK LAP CHOLE LZT030E (CUSTOM PROCEDURE TRAY) ×2 IMPLANT
PAD ARMBOARD 7.5X6 YLW CONV (MISCELLANEOUS) ×2 IMPLANT
PENCIL SMOKE EVACUATOR COATED (MISCELLANEOUS) ×2 IMPLANT
RELOAD 45 VASCULAR/THIN (ENDOMECHANICALS) ×2 IMPLANT
RELOAD STAPLE 45 2.5 WHT GRN (ENDOMECHANICALS) IMPLANT
SET BASIN LINEN APH (SET/KITS/TRAYS/PACK) ×2 IMPLANT
SET TUBE IRRIG SUCTION NO TIP (IRRIGATION / IRRIGATOR) ×1 IMPLANT
SET TUBE SMOKE EVAC HIGH FLOW (TUBING) ×2 IMPLANT
SHEARS HARMONIC ACE PLUS 36CM (ENDOMECHANICALS) ×2 IMPLANT
SUT MNCRL AB 4-0 PS2 18 (SUTURE) ×4 IMPLANT
SUT VICRYL 0 UR6 27IN ABS (SUTURE) ×2 IMPLANT
SYR 20ML LL LF (SYRINGE) ×4 IMPLANT
SYS BAG RETRIEVAL 10MM (BASKET) ×1
SYSTEM BAG RETRIEVAL 10MM (BASKET) ×1 IMPLANT
TRAY FOLEY W/BAG SLVR 16FR (SET/KITS/TRAYS/PACK) ×1
TRAY FOLEY W/BAG SLVR 16FR ST (SET/KITS/TRAYS/PACK) ×1 IMPLANT
TROCAR ENDO BLADELESS 11MM (ENDOMECHANICALS) ×2 IMPLANT
TROCAR ENDO BLADELESS 12MM (ENDOMECHANICALS) ×2 IMPLANT
TROCAR XCEL NON-BLD 5MMX100MML (ENDOMECHANICALS) ×2 IMPLANT
TUBE CONNECTING 12X1/4 (SUCTIONS) ×2 IMPLANT
WARMER LAPAROSCOPE (MISCELLANEOUS) ×2 IMPLANT

## 2020-10-06 NOTE — Transfer of Care (Signed)
Immediate Anesthesia Transfer of Care Note  Patient: Doyl Bitting  Procedure(s) Performed: APPENDECTOMY LAPAROSCOPIC (Abdomen)  Patient Location: PACU  Anesthesia Type:General  Level of Consciousness: drowsy  Airway & Oxygen Therapy: Patient Spontanous Breathing and Patient connected to face mask oxygen  Post-op Assessment: Report given to RN and Post -op Vital signs reviewed and stable  Post vital signs: Reviewed and stable  Last Vitals:  Vitals Value Taken Time  BP 153/85 10/06/20 1131  Temp    Pulse 104 10/06/20 1133  Resp 7 10/06/20 1133  SpO2 94 % 10/06/20 1133  Vitals shown include unvalidated device data.  Last Pain:  Vitals:   10/06/20 1003  TempSrc:   PainSc: 8          Complications: No notable events documented.

## 2020-10-06 NOTE — Anesthesia Preprocedure Evaluation (Signed)
Anesthesia Evaluation  Patient identified by MRN, date of birth, ID band Patient awake    Reviewed: Allergy & Precautions, H&P , NPO status , Patient's Chart, lab work & pertinent test results, reviewed documented beta blocker date and time   Airway Mallampati: II  TM Distance: >3 FB Neck ROM: full    Dental no notable dental hx.    Pulmonary neg pulmonary ROS,    Pulmonary exam normal breath sounds clear to auscultation       Cardiovascular Exercise Tolerance: Good negative cardio ROS   Rhythm:regular Rate:Normal     Neuro/Psych negative neurological ROS  negative psych ROS   GI/Hepatic negative GI ROS, Neg liver ROS,   Endo/Other  negative endocrine ROS  Renal/GU negative Renal ROS  negative genitourinary   Musculoskeletal   Abdominal   Peds  Hematology negative hematology ROS (+)   Anesthesia Other Findings   Reproductive/Obstetrics negative OB ROS                             Anesthesia Physical Anesthesia Plan  ASA: 1 and emergent  Anesthesia Plan: General ETT   Post-op Pain Management:    Induction:   PONV Risk Score and Plan: Ondansetron  Airway Management Planned:   Additional Equipment:   Intra-op Plan:   Post-operative Plan:   Informed Consent: I have reviewed the patients History and Physical, chart, labs and discussed the procedure including the risks, benefits and alternatives for the proposed anesthesia with the patient or authorized representative who has indicated his/her understanding and acceptance.     Dental Advisory Given  Plan Discussed with: CRNA  Anesthesia Plan Comments:         Anesthesia Quick Evaluation

## 2020-10-06 NOTE — Op Note (Signed)
Patient:  Omar [name]  DOB:  06-04-1975  MRN:  [id number]   Preop Diagnosis: Acute appendicitis with possible perforation  Postop Diagnosis: Acute appendicitis with perforation and gangrene  Procedure: Laparoscopic appendectomy  Surgeon: Franky Macho, MD  Anes: General endotracheal  Indications: Patient is [name] who presents with acute appendicitis with possible perforation.  The risks and benefits of the procedure including bleeding, infection, the possibility of an open procedure, and the possibility of perforation were fully explained to the patient, who gave informed consent.  This was done with an interpreter.  Procedure note: The patient was placed in the supine position.  After induction of general endotracheal anesthesia, the abdomen was prepped and draped using the usual sterile technique with ChloraPrep.  Surgical site confirmation was performed.  A supraumbilical incision was made down to the fascia.  A Veress needle was introduced into the abdominal cavity and confirmation of placement was done using the saline drop test.  The abdomen was then insufflated to 15 mmHg pressure.  An 11 mm trocar was introduced into the abdominal cavity under direct visualization without difficulty.  The patient was placed in deeper Trendelenburg position and an additional 12 mm trocar was placed in the suprapubic region and a 5 mm trocar was placed in the left lower quadrant region.  There was a significant amount of fibrinous exudate in the right lower quadrant.  Once I was [name] to free up the overlying bowel, a gangrenous appendix was found with evidence of distal third perforation.  Some murky fluid was present in the right lower quadrant and this was removed with aspiration.  The mesoappendix was divided using the harmonic scalpel.  A vascular Endo GIA was placed across the base the appendix and fired once the appendiceal cecal junction was seen.  The appendix was then removed  using an Endo Catch bag.  The staple line was inspected and noted to be intact.  The right lower quadrant and suprapubic region were copiously irrigated with normal saline.  This was done until the effluent was clear.  All fluid and air were then evacuated from the abdominal cavity prior to removal of the trochars.  All wounds were irrigated with normal saline.  All wounds were injected with Exparel.  The supraumbilical fascia as well as suprapubic fascia were reapproximated using 0 Vicryl interrupted sutures.  All skin incisions were closed using a 4-0 Monocryl subcuticular suture.  Dermabond was applied.  All tape and needle counts were correct at the end of the procedure.  The patient was extubated in the operating room and transferred to PACU in stable condition.  Complications: None  EBL: Minimal  Specimen: Appendix

## 2020-10-06 NOTE — Progress Notes (Signed)
Patient alert and oriented. Tolerating PO fluids and jello without difficulty , Denies pain or discomfort.

## 2020-10-06 NOTE — H&P (View-Only) (Signed)
Subjective: Patient still complains of right-sided abdominal pain.  Objective: Vital signs in last 24 hours: Temp:  [98.2 F (36.8 C)-101.1 F (38.4 C)] 98.2 F (36.8 C) (07/13 0620) Pulse Rate:  [73-102] 102 (07/13 0620) Resp:  [18-20] 18 (07/12 2234) BP: (123-152)/(90-107) 149/96 (07/13 0620) SpO2:  [92 %-97 %] 92 % (07/13 0620) Weight:  [81.6 kg] 81.6 kg (07/12 1547)    Intake/Output from previous day: 07/12 0701 - 07/13 0700 In: 595.2 [I.V.:553.8; IV Piggyback:41.5] Out: -  Intake/Output this shift: No intake/output data recorded.  General appearance: alert, cooperative, and no distress GI: Discomfort to palpation along right side of abdomen especially in the right lower quadrant.  No diffuse rigidity noted.  Lab Results:  Recent Labs    10/05/20 1633 10/06/20 0430  WBC 19.4* 18.6*  HGB 16.9 15.9  HCT 47.4 46.3  PLT 237 224   BMET Recent Labs    10/05/20 1633 10/06/20 0430  NA 134* 135  K 3.4* 4.7  CL 98 100  CO2 25 24  GLUCOSE 152* 150*  BUN 15 17  CREATININE 0.91 0.92  CALCIUM 9.1 8.7*   PT/INR No results for input(s): LABPROT, INR in the last 72 hours.  Studies/Results: CT ABDOMEN PELVIS W CONTRAST  Result Date: 10/05/2020 CLINICAL DATA:  Right lower quadrant abdominal pain and nausea x4 days. EXAM: CT ABDOMEN AND PELVIS WITH CONTRAST TECHNIQUE: Multidetector CT imaging of the abdomen and pelvis was performed using the standard protocol following bolus administration of intravenous contrast. CONTRAST:  OMNIPAQUE IOHEXOL 300 MG/ML  SOLN COMPARISON:  None. FINDINGS: Lower chest: No acute abnormality. Normal size heart. No significant pericardial effusion/thickening. Hepatobiliary: No suspicious hepatic lesion. Gallbladder is unremarkable. No biliary ductal dilation. Pancreas: Within normal limits. Spleen: Within normal limits. Adrenals/Urinary Tract: Adrenal glands are unremarkable. Kidneys are normal, without renal calculi, solid enhancing  lesion, or hydronephrosis. Bladder is unremarkable for degree of distension. Stomach/Bowel: Small hiatal hernia otherwise stomach is grossly unremarkable. No pathologic dilation of small bowel. Descending and sigmoid colon are predominantly decompressed limiting evaluation. Small volume of formed stool in the ascending colon with gaseous distension of the transverse colon. Acute inflammation of a fluid-filled enlarged appendix with a 6 mm appendicoliths. Frayed appearance of the tip of the appendix with right lower quadrant free fluid. Vascular/Lymphatic: No abdominal aortic aneurysm. Prominent right lower quadrant lymph nodes without adenopathy by size criteria. No pathologically enlarged abdominal or pelvic lymph nodes. Reproductive: Mild prostatic enlargement. Other: Mesenteric edema with free fluid in the right lower quadrant and pelvis. There are 2 areas of fluid which appear more organized than with expected for normal free fluid for instance along the cecum measuring 3.4 cm on image 64/2 and along a loop of ileum in the right hemipelvis measuring 2.2 cm on image 75/2, possibly representing organizing collections. No pneumoperitoneum. Musculoskeletal: No acute osseous abnormality. IMPRESSION: Acute appendicitis with possible perforation. Right lower quadrant pelvic free fluid with 2 areas of fluid which appear more organized than with expected in the right lower quadrant and pelvis, possibly representing developing phlegmonous collections. No pneumoperitoneum. Electronically Signed   By: Maudry Mayhew MD   On: 10/05/2020 19:53    Anti-infectives: Anti-infectives (From admission, onward)    Start     Dose/Rate Route Frequency Ordered Stop   10/06/20 0600  piperacillin-tazobactam (ZOSYN) IVPB 3.375 g        3.375 g 12.5 mL/hr over 240 Minutes Intravenous Every 8 hours 10/05/20 2136     10/06/20 0000  piperacillin-tazobactam (ZOSYN) IVPB 3.375 g  Status:  Discontinued        3.375 g 100 mL/hr over 30  Minutes Intravenous Every 6 hours 10/05/20 2134 10/05/20 2136   10/05/20 2045  piperacillin-tazobactam (ZOSYN) IVPB 3.375 g        3.375 g 100 mL/hr over 30 Minutes Intravenous  Once 10/05/20 2040 10/05/20 2127       Assessment/Plan: s/p Procedure(s): APPENDECTOMY LAPAROSCOPIC Impression: Patient with probable early perforated appendicitis.  We will proceed with laparoscopic appendectomy.  The risks and benefits of the procedure were explained to the patient.  These were explained yesterday evening to the patient.  LOS: 1 day    Franky Macho 10/06/2020

## 2020-10-06 NOTE — Progress Notes (Signed)
Awake. O2 sat 88% on room air. Instructed on incentive spirometer. 1000 ml obtained. O2 sat increased to 90%. O2 restarted with nasal cannula at 3 l/m. O2 sat increased to 91%.

## 2020-10-06 NOTE — Progress Notes (Signed)
Dr Johnnette Litter at bedside to check pt re: O2 sat 90%. No new orders given. Instructed to leave pt on O2 overnight. Cleared for d/c to room.

## 2020-10-06 NOTE — Progress Notes (Signed)
PROGRESS NOTE    Omar [name]  WIO:[id number] DOB: [birth year] DOA: 10/05/2020 PCP: Oneita Hurt, No     Brief Narrative:  Omar [name] is [name] who is Spanish-speaking with no significant past medical history who presents to the emergency department with acute onset of lower abdominal pain which started 2 days ago after eating beef taco.  He was seen in [facility] urgent care initially and was thought to have food poisoning.  He was given a prescription for Bentyl.  His pain has been worse today.  He had associated diaphoresis with it as well as nausea without vomiting.  His pain has been constant with no radiation.  This last bowel movement was yesterday morning and it was dark green but formed.  He denies any fever or chills at home however he was having low-grade fever here in the ER.  No bright red bleeding per rectum or melena.  He denies any dyspnea or cough or wheezing.  No chest pain or palpitations. CT A/P revealed acute appendicitis with possible perforation.  Dr. Lovell Sheehan was consulted.  Patient was started on IV Zosyn.  New events last 24 hours / Subjective: Patient seen and evaluated with iPad interpreter.  Patient states that his pain is tolerable on pain medication.  No nausea or vomiting, no chest pain or shortness of breath.  Has never had any surgeries in the past.  Assessment & Plan:   Active Problems:   Acute appendicitis   Sepsis secondary to acute perforated appendicitis with gangrene -Sepsis present on admission -Fever 101.1, heart rate 98, WBC 19.4 -To OR today with Dr. Lovell Sheehan -Continue IV Zosyn  Graves' disease -Followed by [facility] endocrinology -Continue methimazole   DVT prophylaxis:  SCD's Start: 10/06/20 0748 enoxaparin (LOVENOX) injection 40 mg Start: 10/05/20 2130  Code Status:     Code Status Orders  (From admission, onward)           Start     Ordered   10/05/20 2119  Full code  Continuous        10/05/20 2125            Code Status History     This patient has a current code status but no historical code status.      Family Communication: None  Disposition Plan:  Status is: Inpatient  Remains inpatient appropriate because:Inpatient level of care appropriate due to severity of illness  Dispo: The patient is from: Home              Anticipated d/c is to: Home              Patient currently is not medically stable to d/c.   Difficult to place patient No      Consultants:  General surgery  Procedures:  Lap appy 7/13  Antimicrobials:  Anti-infectives (From admission, onward)    Start     Dose/Rate Route Frequency Ordered Stop   10/06/20 0600  [MAR Hold]  piperacillin-tazobactam (ZOSYN) IVPB 3.375 g        (MAR Hold since Wed 10/06/2020 at 0919.Hold Reason: Transfer to a Procedural area)   3.375 g 12.5 mL/hr over 240 Minutes Intravenous Every 8 hours 10/05/20 2136     10/06/20 0000  piperacillin-tazobactam (ZOSYN) IVPB 3.375 g  Status:  Discontinued        3.375 g 100 mL/hr over 30 Minutes Intravenous Every 6 hours 10/05/20 2134 10/05/20 2136   10/05/20 2045  piperacillin-tazobactam (ZOSYN) IVPB 3.375 g  3.375 g 100 mL/hr over 30 Minutes Intravenous  Once 10/05/20 2040 10/05/20 2127        Objective: Vitals:   10/06/20 0214 10/06/20 0620 10/06/20 0932 10/06/20 1130  BP: 138/90 (!) 149/96 (!) 140/99 (!) 153/85  Pulse: 96 (!) 102 100 (!) 102  Resp:   15 10  Temp: 98.2 F (36.8 C) 98.2 F (36.8 C) 99.3 F (37.4 C) 97.7 F (36.5 C)  TempSrc: Oral Oral Oral   SpO2:  92% 93% 95%  Weight:      Height:        Intake/Output Summary (Last 24 hours) at 10/06/2020 1145 Last data filed at 10/06/2020 1059 Gross per 24 hour  Intake 1595.23 ml  Output 410 ml  Net 1185.23 ml   Filed Weights   10/05/20 1547  Weight: 81.6 kg    Examination:  General exam: Appears calm and comfortable  Respiratory system: Clear to auscultation. Respiratory effort normal. No respiratory  distress. No conversational dyspnea.  Cardiovascular system: S1 & S2 heard, RRR. No murmurs. No pedal edema. Gastrointestinal system: Abdomen is mildly distended with tenderness to palpation right hemiabdomen Central nervous system: Alert and oriented. No focal neurological deficits. Speech clear.  Extremities: Symmetric in appearance  Skin: No rashes, lesions or ulcers on exposed skin  Psychiatry: Judgement and insight appear normal. Mood & affect appropriate.   Data Reviewed: I have personally reviewed following labs and imaging studies  CBC: Recent Labs  Lab 10/05/20 1633 10/06/20 0430  WBC 19.4* 18.6*  NEUTROABS 17.0*  --   HGB 16.9 15.9  HCT 47.4 46.3  MCV 89.9 91.3  PLT 237 224   Basic Metabolic Panel: Recent Labs  Lab 10/05/20 1633 10/06/20 0430  NA 134* 135  K 3.4* 4.7  CL 98 100  CO2 25 24  GLUCOSE 152* 150*  BUN 15 17  CREATININE 0.91 0.92  CALCIUM 9.1 8.7*   GFR: Estimated Creatinine Clearance: 101.4 mL/min (by C-G formula based on SCr of 0.92 mg/dL). Liver Function Tests: Recent Labs  Lab 10/05/20 1633  AST 30  ALT 33  ALKPHOS 163*  BILITOT 1.8*  PROT 8.1  ALBUMIN 4.5   Recent Labs  Lab 10/05/20 1633  LIPASE 28   No results for input(s): AMMONIA in the last 168 hours. Coagulation Profile: No results for input(s): INR, PROTIME in the last 168 hours. Cardiac Enzymes: No results for input(s): CKTOTAL, CKMB, CKMBINDEX, TROPONINI in the last 168 hours. BNP (last 3 results) No results for input(s): PROBNP in the last 8760 hours. HbA1C: No results for input(s): HGBA1C in the last 72 hours. CBG: No results for input(s): GLUCAP in the last 168 hours. Lipid Profile: No results for input(s): CHOL, HDL, LDLCALC, TRIG, CHOLHDL, LDLDIRECT in the last 72 hours. Thyroid Function Tests: No results for input(s): TSH, T4TOTAL, FREET4, T3FREE, THYROIDAB in the last 72 hours. Anemia Panel: No results for input(s): VITAMINB12, FOLATE, FERRITIN, TIBC, IRON,  RETICCTPCT in the last 72 hours. Sepsis Labs: No results for input(s): PROCALCITON, LATICACIDVEN in the last 168 hours.  Recent Results (from the past 240 hour(s))  Resp Panel by RT-PCR (Flu A&B, Covid) Nasopharyngeal Swab     Status: None   Collection Time: 10/05/20  4:17 PM   Specimen: Nasopharyngeal Swab; Nasopharyngeal(NP) swabs in vial transport medium  Result Value Ref Range Status   SARS Coronavirus 2 by RT PCR NEGATIVE NEGATIVE Final    Comment: (NOTE) SARS-CoV-2 target nucleic acids are NOT DETECTED.  The SARS-CoV-2 RNA is  generally detectable in upper respiratory specimens during the acute phase of infection. The lowest concentration of SARS-CoV-2 viral copies this assay can detect is 138 copies/mL. A negative result does not preclude SARS-Cov-2 infection and should not be used as the sole basis for treatment or other patient management decisions. A negative result may occur with  improper specimen collection/handling, submission of specimen other than nasopharyngeal swab, presence of viral mutation(s) within the areas targeted by this assay, and inadequate number of viral copies(<138 copies/mL). A negative result must be combined with clinical observations, patient history, and epidemiological information. The expected result is Negative.  Fact Sheet for Patients:  BloggerCourse.com  Fact Sheet for Healthcare Providers:  SeriousBroker.it  This test is no t yet approved or cleared by the [place] FDA and  has been authorized for detection and/or diagnosis of SARS-CoV-2 by FDA under an Emergency Use Authorization (EUA). This EUA will remain  in effect (meaning this test can be used) for the duration of the COVID-19 declaration under Section 564(b)(1) of the Act, 21 U.S.C.section 360bbb-3(b)(1), unless the authorization is terminated  or revoked sooner.       Influenza A by PCR NEGATIVE NEGATIVE Final   Influenza  B by PCR NEGATIVE NEGATIVE Final    Comment: (NOTE) The Xpert Xpress SARS-CoV-2/FLU/RSV plus assay is intended as an aid in the diagnosis of influenza from Nasopharyngeal swab specimens and should not be used as a sole basis for treatment. Nasal washings and aspirates are unacceptable for Xpert Xpress SARS-CoV-2/FLU/RSV testing.  Fact Sheet for Patients: BloggerCourse.com  Fact Sheet for Healthcare Providers: SeriousBroker.it  This test is not yet approved or cleared by the [place] FDA and has been authorized for detection and/or diagnosis of SARS-CoV-2 by FDA under an Emergency Use Authorization (EUA). This EUA will remain in effect (meaning this test can be used) for the duration of the COVID-19 declaration under Section 564(b)(1) of the Act, 21 U.S.C. section 360bbb-3(b)(1), unless the authorization is terminated or revoked.  Performed at [facility], [address] [place], [place] [address]       Radiology Studies: CT ABDOMEN PELVIS W CONTRAST  Result Date: 10/05/2020 CLINICAL DATA:  Right lower quadrant abdominal pain and nausea x4 days. EXAM: CT ABDOMEN AND PELVIS WITH CONTRAST TECHNIQUE: Multidetector CT imaging of the abdomen and pelvis was performed using the standard protocol following bolus administration of intravenous contrast. CONTRAST:  OMNIPAQUE IOHEXOL 300 MG/ML  SOLN COMPARISON:  None. FINDINGS: Lower chest: No acute abnormality. Normal size heart. No significant pericardial effusion/thickening. Hepatobiliary: No suspicious hepatic lesion. Gallbladder is unremarkable. No biliary ductal dilation. Pancreas: Within normal limits. Spleen: Within normal limits. Adrenals/Urinary Tract: Adrenal glands are unremarkable. Kidneys are normal, without renal calculi, solid enhancing lesion, or hydronephrosis. Bladder is unremarkable for degree of distension. Stomach/Bowel: Small hiatal hernia otherwise stomach is  grossly unremarkable. No pathologic dilation of small bowel. Descending and sigmoid colon are predominantly decompressed limiting evaluation. Small volume of formed stool in the ascending colon with gaseous distension of the transverse colon. Acute inflammation of a fluid-filled enlarged appendix with a 6 mm appendicoliths. Frayed appearance of the tip of the appendix with right lower quadrant free fluid. Vascular/Lymphatic: No abdominal aortic aneurysm. Prominent right lower quadrant lymph nodes without adenopathy by size criteria. No pathologically enlarged abdominal or pelvic lymph nodes. Reproductive: Mild prostatic enlargement. Other: Mesenteric edema with free fluid in the right lower quadrant and pelvis. There are 2 areas of fluid which appear more organized than with  expected for normal free fluid for instance along the cecum measuring 3.4 cm on image 64/2 and along a loop of ileum in the right hemipelvis measuring 2.2 cm on image 75/2, possibly representing organizing collections. No pneumoperitoneum. Musculoskeletal: No acute osseous abnormality. IMPRESSION: Acute appendicitis with possible perforation. Right lower quadrant pelvic free fluid with 2 areas of fluid which appear more organized than with expected in the right lower quadrant and pelvis, possibly representing developing phlegmonous collections. No pneumoperitoneum. Electronically Signed   By: Maudry MayhewJeffrey  Waltz MD   On: 10/05/2020 19:53      Scheduled Meds:  Chlorhexidine Gluconate Cloth  6 each Topical Once   [MAR Hold] enoxaparin (LOVENOX) injection  40 mg Subcutaneous Q24H   Continuous Infusions:  0.9 % NaCl with KCl 20 mEq / L 125 mL/hr at 10/06/20 0310   lactated ringers 10 mL/hr at 10/06/20 1002   [MAR Hold] piperacillin-tazobactam (ZOSYN)  IV 3.375 g (10/06/20 0532)     LOS: 1 day      Time spent: 25 minutes   Noralee StainJennifer Winford Hehn, DO [facility] 10/06/2020, 11:45 AM   Available via Epic secure chat 7am-7pm After  these hours, please refer to coverage provider listed on [email or web address]

## 2020-10-06 NOTE — Anesthesia Postprocedure Evaluation (Signed)
Anesthesia Post Note  Patient: Omar [name]  Procedure(s) Performed: APPENDECTOMY LAPAROSCOPIC (Abdomen)  Patient location during evaluation: PACU Anesthesia Type: General Level of consciousness: awake and alert Pain management: pain level controlled Vital Signs Assessment: post-procedure vital signs reviewed and stable Respiratory status: spontaneous breathing, nonlabored ventilation, respiratory function stable and patient connected to nasal cannula oxygen Cardiovascular status: blood pressure returned to baseline and stable Postop Assessment: no apparent nausea or vomiting Anesthetic complications: no   No notable events documented.   Last Vitals:  Vitals:   10/06/20 0932 10/06/20 1130  BP: (!) 140/99 (!) 153/85  Pulse: 100 (!) 102  Resp: 15   Temp: 37.4 C   SpO2: 93%     Last Pain:  Vitals:   10/06/20 1003  TempSrc:   PainSc: 8                  Windell Norfolk

## 2020-10-06 NOTE — Anesthesia Procedure Notes (Addendum)
Procedure Name: Intubation Date/Time: 10/06/2020 10:18 AM Performed by: Karna Dupes, CRNA Pre-anesthesia Checklist: Patient identified, Emergency Drugs available, Suction available and Patient being monitored Patient Re-evaluated:Patient Re-evaluated prior to induction Oxygen Delivery Method: Circle system utilized Preoxygenation: Pre-oxygenation with 100% oxygen Induction Type: IV induction Ventilation: Mask ventilation without difficulty Laryngoscope Size: Glidescope and 4 Grade View: Grade I Tube type: Oral Tube size: 7.0 mm Number of attempts: 2 (Dl x1 with Mac 4 no view; DL X1 with Glidescope 4 grade 1 view) Airway Equipment and Method: Stylet and Video-laryngoscopy Placement Confirmation: ETT inserted through vocal cords under direct vision, positive ETCO2 and breath sounds checked- equal and bilateral Secured at: 22 cm Tube secured with: Tape Dental Injury: Teeth and Oropharynx as per pre-operative assessment

## 2020-10-06 NOTE — Progress Notes (Signed)
Subjective: Patient still complains of right-sided abdominal pain.  Objective: Vital signs in last 24 hours: Temp:  [98.2 F (36.8 C)-101.1 F (38.4 C)] 98.2 F (36.8 C) (07/13 0620) Pulse Rate:  [73-102] 102 (07/13 0620) Resp:  [18-20] 18 (07/12 2234) BP: (123-152)/(90-107) 149/96 (07/13 0620) SpO2:  [92 %-97 %] 92 % (07/13 0620) Weight:  [81.6 kg] 81.6 kg (07/12 1547)    Intake/Output from previous day: 07/12 0701 - 07/13 0700 In: 595.2 [I.V.:553.8; IV Piggyback:41.5] Out: -  Intake/Output this shift: No intake/output data recorded.  General appearance: alert, cooperative, and no distress GI: Discomfort to palpation along right side of abdomen especially in the right lower quadrant.  No diffuse rigidity noted.  Lab Results:  Recent Labs    10/05/20 1633 10/06/20 0430  WBC 19.4* 18.6*  HGB 16.9 15.9  HCT 47.4 46.3  PLT 237 224   BMET Recent Labs    10/05/20 1633 10/06/20 0430  NA 134* 135  K 3.4* 4.7  CL 98 100  CO2 25 24  GLUCOSE 152* 150*  BUN 15 17  CREATININE 0.91 0.92  CALCIUM 9.1 8.7*   PT/INR No results for input(s): LABPROT, INR in the last 72 hours.  Studies/Results: CT ABDOMEN PELVIS W CONTRAST  Result Date: 10/05/2020 CLINICAL DATA:  Right lower quadrant abdominal pain and nausea x4 days. EXAM: CT ABDOMEN AND PELVIS WITH CONTRAST TECHNIQUE: Multidetector CT imaging of the abdomen and pelvis was performed using the standard protocol following bolus administration of intravenous contrast. CONTRAST:  OMNIPAQUE IOHEXOL 300 MG/ML  SOLN COMPARISON:  None. FINDINGS: Lower chest: No acute abnormality. Normal size heart. No significant pericardial effusion/thickening. Hepatobiliary: No suspicious hepatic lesion. Gallbladder is unremarkable. No biliary ductal dilation. Pancreas: Within normal limits. Spleen: Within normal limits. Adrenals/Urinary Tract: Adrenal glands are unremarkable. Kidneys are normal, without renal calculi, solid enhancing  lesion, or hydronephrosis. Bladder is unremarkable for degree of distension. Stomach/Bowel: Small hiatal hernia otherwise stomach is grossly unremarkable. No pathologic dilation of small bowel. Descending and sigmoid colon are predominantly decompressed limiting evaluation. Small volume of formed stool in the ascending colon with gaseous distension of the transverse colon. Acute inflammation of a fluid-filled enlarged appendix with a 6 mm appendicoliths. Frayed appearance of the tip of the appendix with right lower quadrant free fluid. Vascular/Lymphatic: No abdominal aortic aneurysm. Prominent right lower quadrant lymph nodes without adenopathy by size criteria. No pathologically enlarged abdominal or pelvic lymph nodes. Reproductive: Mild prostatic enlargement. Other: Mesenteric edema with free fluid in the right lower quadrant and pelvis. There are 2 areas of fluid which appear more organized than with expected for normal free fluid for instance along the cecum measuring 3.4 cm on image 64/2 and along a loop of ileum in the right hemipelvis measuring 2.2 cm on image 75/2, possibly representing organizing collections. No pneumoperitoneum. Musculoskeletal: No acute osseous abnormality. IMPRESSION: Acute appendicitis with possible perforation. Right lower quadrant pelvic free fluid with 2 areas of fluid which appear more organized than with expected in the right lower quadrant and pelvis, possibly representing developing phlegmonous collections. No pneumoperitoneum. Electronically Signed   By: Maudry Mayhew MD   On: 10/05/2020 19:53    Anti-infectives: Anti-infectives (From admission, onward)    Start     Dose/Rate Route Frequency Ordered Stop   10/06/20 0600  piperacillin-tazobactam (ZOSYN) IVPB 3.375 g        3.375 g 12.5 mL/hr over 240 Minutes Intravenous Every 8 hours 10/05/20 2136     10/06/20 0000  piperacillin-tazobactam (ZOSYN) IVPB 3.375 g  Status:  Discontinued        3.375 g 100 mL/hr over 30  Minutes Intravenous Every 6 hours 10/05/20 2134 10/05/20 2136   10/05/20 2045  piperacillin-tazobactam (ZOSYN) IVPB 3.375 g        3.375 g 100 mL/hr over 30 Minutes Intravenous  Once 10/05/20 2040 10/05/20 2127       Assessment/Plan: s/p Procedure(s): APPENDECTOMY LAPAROSCOPIC Impression: Patient with probable early perforated appendicitis.  We will proceed with laparoscopic appendectomy.  The risks and benefits of the procedure were explained to the patient.  These were explained yesterday evening to the patient.  LOS: 1 day    Carlia Bomkamp 10/06/2020  

## 2020-10-06 NOTE — Interval H&P Note (Signed)
History and Physical Interval Note:  10/06/2020 9:43 AM  Omar [name]  has presented today for surgery, with the diagnosis of perforated appendicitis.  The various methods of treatment have been discussed with the patient and family. After consideration of risks, benefits and other options for treatment, the patient has consented to  Procedure(s): APPENDECTOMY LAPAROSCOPIC (N/A) as a surgical intervention.  The patient's history has been reviewed, patient examined, no change in status, stable for surgery.  I have reviewed the patient's chart and labs.  Questions were answered to the patient's satisfaction.     Franky Macho

## 2020-10-07 ENCOUNTER — Encounter (HOSPITAL_COMMUNITY): Payer: Self-pay | Admitting: General Surgery

## 2020-10-07 LAB — BASIC METABOLIC PANEL
Anion gap: 6 (ref 5–15)
BUN: 22 mg/dL — ABNORMAL HIGH (ref 6–20)
CO2: 28 mmol/L (ref 22–32)
Calcium: 8.4 mg/dL — ABNORMAL LOW (ref 8.9–10.3)
Chloride: 102 mmol/L (ref 98–111)
Creatinine, Ser: 0.95 mg/dL (ref 0.61–1.24)
GFR, Estimated: >60 mL/min (ref >60)
Glucose, Bld: 123 mg/dL — ABNORMAL HIGH (ref 70–99)
Potassium: 4.4 mmol/L (ref 3.5–5.1)
Sodium: 136 mmol/L (ref 135–145)

## 2020-10-07 LAB — CBC
HCT: 39.1 % (ref 39.0–52.0)
Hemoglobin: 13.1 g/dL (ref 13.0–17.0)
MCH: 31.4 pg (ref 26.0–34.0)
MCHC: 33.5 g/dL (ref 30.0–36.0)
MCV: 93.8 fL (ref 80.0–100.0)
Platelets: 179 10*3/uL (ref 150–400)
RBC: 4.17 MIL/uL — ABNORMAL LOW (ref 4.22–5.81)
RDW: 12.5 % (ref 11.5–15.5)
WBC: 11.3 10*3/uL — ABNORMAL HIGH (ref 4.0–10.5)
nRBC: 0 % (ref 0.0–0.2)

## 2020-10-07 MED ORDER — HYDROCODONE-ACETAMINOPHEN 5-325 MG PO TABS: 1.0000 | ORAL_TABLET | ORAL | 0 refills | Status: AC | PRN

## 2020-10-07 NOTE — Discharge Summary (Signed)
Physician Discharge Summary  Patient ID: Omar [name] MRN: [id number] DOB/AGE: 07-08-1975 45 y.o.  Admit date: 10/05/2020 Discharge date: 10/07/2020  Admission Diagnoses: Acute appendicitis with perforation  Discharge Diagnoses: Same Active Problems:   Acute appendicitis   Discharged Condition: good  Hospital Course: Patient is [name] who presented to the emergency room with a 48-hour history of worsening right lower quadrant abdominal pain.  CT scan of the abdomen revealed acute appendicitis with possible perforation.  He was taken to the operating room on 10/06/2020 and underwent laparoscopic appendectomy.  He tolerated the surgery well.  A gangrenous appendix was found with minimal perforation.  His postoperative course was unremarkable.  His diet was advanced out difficulty.  His leukocytosis has almost normalized.  He is being discharged home on 10/07/2020 in good and improving condition.  Treatments: surgery: Laparoscopic appendectomy on 10/06/2020  Discharge Exam: Blood pressure 131/80, pulse 94, temperature 98.8 F (37.1 C), temperature source Oral, resp. rate 19, height 5\' 9"  (1.753 m), weight 81.6 kg, SpO2 95 %. General appearance: alert, cooperative, and no distress Resp: clear to auscultation bilaterally Cardio: regular rate and rhythm, S1, S2 normal, no murmur, click, rub or gallop GI: Soft, incisions healing well.  Disposition: Discharge disposition: 01-Home or Self Care       Discharge Instructions     Diet - low sodium heart healthy   Complete by: As directed    Increase activity slowly   Complete by: As directed       Allergies as of 10/07/2020   No Known Allergies      Medication List     TAKE these medications    bismuth subsalicylate 262 MG/15ML suspension Commonly known as: PEPTO BISMOL Take 30 mLs by mouth 3 (three) times daily.   dicyclomine 20 MG tablet Commonly known as: BENTYL Take 20 mg by mouth 4 (four) times daily  as needed (abdominal cramps).   HYDROcodone-acetaminophen 5-325 MG tablet Commonly known as: Norco Take 1 tablet by mouth every 4 (four) hours as needed for moderate pain.   loratadine 10 MG tablet Commonly known as: CLARITIN Take by mouth.   methimazole 10 MG tablet Commonly known as: TAPAZOLE Take 1 tablet by mouth daily.   ondansetron 8 MG disintegrating tablet Commonly known as: ZOFRAN-ODT Take by mouth.        Follow-up Information     10/09/2020, MD Follow up.   Specialty: General Surgery Why: As needed.  Will call you in two weeks for follow up. Contact information: [address] [place] [address]                 Signed: [phone] 10/07/2020, 8:26 AM

## 2020-10-07 NOTE — Progress Notes (Signed)
PROGRESS NOTE    Omar [name]  EZM:[id number] DOB: 1975/06/13 DOA: 10/05/2020 PCP: Oneita Hurt, No     Brief Narrative:  Omar [name] is [name] who is Spanish-speaking with no significant past medical history who presents to the emergency department with acute onset of lower abdominal pain which started 2 days ago after eating beef taco.  He was seen in [facility] urgent care initially and was thought to have food poisoning.  He was given a prescription for Bentyl.  His pain has been worse today.  He had associated diaphoresis with it as well as nausea without vomiting.  His pain has been constant with no radiation.  This last bowel movement was yesterday morning and it was dark green but formed.  He denies any fever or chills at home however he was having low-grade fever here in the ER.  No bright red bleeding per rectum or melena.  He denies any dyspnea or cough or wheezing.  No chest pain or palpitations. CT A/P revealed acute appendicitis with possible perforation.  Dr. Lovell Sheehan was consulted.  Patient was started on IV Zosyn.  Patient underwent laparoscopic appendectomy with Dr. Lovell Sheehan on 7/13.  New events last 24 hours / Subjective: Patient seen and examined with iPad interpreter.  Had a bowel movement this morning, tolerated regular diet yesterday.  Patient to be discharged by general surgery team today.  Assessment & Plan:   Active Problems:   Acute appendicitis   Sepsis secondary to acute perforated appendicitis with gangrene -Sepsis present on admission -Status post laparoscopic appendectomy 7/13  Graves' disease -Followed by [facility] endocrinology -Continue methimazole   DVT prophylaxis:  SCD's Start: 10/06/20 1403 enoxaparin (LOVENOX) injection 40 mg Start: 10/05/20 2130  Code Status:     Code Status Orders  (From admission, onward)           Start     Ordered   10/05/20 2119  Full code  Continuous        10/05/20 2125           Code Status  History     This patient has a current code status but no historical code status.      Family Communication: None  Disposition Plan:  Status is: Inpatient  Remains inpatient appropriate because:Inpatient level of care appropriate due to severity of illness  Dispo: The patient is from: Home              Anticipated d/c is to: Home              Patient currently is medically stable to d/c.   Difficult to place patient No      Consultants:  General surgery  Procedures:  Lap appy 7/13  Antimicrobials:  Anti-infectives (From admission, onward)    Start     Dose/Rate Route Frequency Ordered Stop   10/06/20 0600  piperacillin-tazobactam (ZOSYN) IVPB 3.375 g        3.375 g 12.5 mL/hr over 240 Minutes Intravenous Every 8 hours 10/05/20 2136     10/06/20 0000  piperacillin-tazobactam (ZOSYN) IVPB 3.375 g  Status:  Discontinued        3.375 g 100 mL/hr over 30 Minutes Intravenous Every 6 hours 10/05/20 2134 10/05/20 2136   10/05/20 2045  piperacillin-tazobactam (ZOSYN) IVPB 3.375 g        3.375 g 100 mL/hr over 30 Minutes Intravenous  Once 10/05/20 2040 10/05/20 2127        Objective: Vitals:  10/06/20 2036 10/06/20 2200 10/07/20 0158 10/07/20 0604  BP: 116/74 115/68 118/74 131/80  Pulse: 88 83 89 94  Resp: 19 20 19 19   Temp: 99.5 F (37.5 C) 98.3 F (36.8 C) 98.6 F (37 C) 98.8 F (37.1 C)  TempSrc: Oral Oral Oral Oral  SpO2: 100% 95% 95% 95%  Weight:      Height:        Intake/Output Summary (Last 24 hours) at 10/07/2020 1059 Last data filed at 10/06/2020 1819 Gross per 24 hour  Intake 575.86 ml  Output --  Net 575.86 ml    Filed Weights   10/05/20 1547  Weight: 81.6 kg   Examination: General exam: Appears calm and comfortable  Respiratory system: Clear to auscultation. Respiratory effort normal. Cardiovascular system: S1 & S2 heard, RRR. No pedal edema. Gastrointestinal system: Abdomen is nondistended, soft and mildly tender to palpation Central  nervous system: Alert and oriented. Non focal exam. Speech clear  Extremities: Symmetric in appearance bilaterally  Skin: No rashes, lesions or ulcers on exposed skin  Psychiatry: Judgement and insight appear stable. Mood & affect appropriate.    Data Reviewed: I have personally reviewed following labs and imaging studies  CBC: Recent Labs  Lab 10/05/20 1633 10/06/20 0430 10/07/20 0344  WBC 19.4* 18.6* 11.3*  NEUTROABS 17.0*  --   --   HGB 16.9 15.9 13.1  HCT 47.4 46.3 39.1  MCV 89.9 91.3 93.8  PLT 237 224 179    Basic Metabolic Panel: Recent Labs  Lab 10/05/20 1633 10/06/20 0430 10/07/20 0344  NA 134* 135 136  K 3.4* 4.7 4.4  CL 98 100 102  CO2 25 24 28   GLUCOSE 152* 150* 123*  BUN 15 17 22*  CREATININE 0.91 0.92 0.95  CALCIUM 9.1 8.7* 8.4*    GFR: Estimated Creatinine Clearance: 98.2 mL/min (by C-G formula based on SCr of 0.95 mg/dL). Liver Function Tests: Recent Labs  Lab 10/05/20 1633  AST 30  ALT 33  ALKPHOS 163*  BILITOT 1.8*  PROT 8.1  ALBUMIN 4.5    Recent Labs  Lab 10/05/20 1633  LIPASE 28    No results for input(s): AMMONIA in the last 168 hours. Coagulation Profile: No results for input(s): INR, PROTIME in the last 168 hours. Cardiac Enzymes: No results for input(s): CKTOTAL, CKMB, CKMBINDEX, TROPONINI in the last 168 hours. BNP (last 3 results) No results for input(s): PROBNP in the last 8760 hours. HbA1C: No results for input(s): HGBA1C in the last 72 hours. CBG: No results for input(s): GLUCAP in the last 168 hours. Lipid Profile: No results for input(s): CHOL, HDL, LDLCALC, TRIG, CHOLHDL, LDLDIRECT in the last 72 hours. Thyroid Function Tests: No results for input(s): TSH, T4TOTAL, FREET4, T3FREE, THYROIDAB in the last 72 hours. Anemia Panel: No results for input(s): VITAMINB12, FOLATE, FERRITIN, TIBC, IRON, RETICCTPCT in the last 72 hours. Sepsis Labs: No results for input(s): PROCALCITON, LATICACIDVEN in the last 168  hours.  Recent Results (from the past 240 hour(s))  Resp Panel by RT-PCR (Flu A&B, Covid) Nasopharyngeal Swab     Status: None   Collection Time: 10/05/20  4:17 PM   Specimen: Nasopharyngeal Swab; Nasopharyngeal(NP) swabs in vial transport medium  Result Value Ref Range Status   SARS Coronavirus 2 by RT PCR NEGATIVE NEGATIVE Final    Comment: (NOTE) SARS-CoV-2 target nucleic acids are NOT DETECTED.  The SARS-CoV-2 RNA is generally detectable in upper respiratory specimens during the acute phase of infection. The lowest concentration of SARS-CoV-2  viral copies this assay can detect is 138 copies/mL. A negative result does not preclude SARS-Cov-2 infection and should not be used as the sole basis for treatment or other patient management decisions. A negative result may occur with  improper specimen collection/handling, submission of specimen other than nasopharyngeal swab, presence of viral mutation(s) within the areas targeted by this assay, and inadequate number of viral copies(<138 copies/mL). A negative result must be combined with clinical observations, patient history, and epidemiological information. The expected result is Negative.  Fact Sheet for Patients:  BloggerCourse.com  Fact Sheet for Healthcare Providers:  SeriousBroker.it  This test is no t yet approved or cleared by the [place] FDA and  has been authorized for detection and/or diagnosis of SARS-CoV-2 by FDA under an Emergency Use Authorization (EUA). This EUA will remain  in effect (meaning this test can be used) for the duration of the COVID-19 declaration under Section 564(b)(1) of the Act, 21 U.S.C.section 360bbb-3(b)(1), unless the authorization is terminated  or revoked sooner.       Influenza A by PCR NEGATIVE NEGATIVE Final   Influenza B by PCR NEGATIVE NEGATIVE Final    Comment: (NOTE) The Xpert Xpress SARS-CoV-2/FLU/RSV plus assay is intended  as an aid in the diagnosis of influenza from Nasopharyngeal swab specimens and should not be used as a sole basis for treatment. Nasal washings and aspirates are unacceptable for Xpert Xpress SARS-CoV-2/FLU/RSV testing.  Fact Sheet for Patients: BloggerCourse.com  Fact Sheet for Healthcare Providers: SeriousBroker.it  This test is not yet approved or cleared by the [place] FDA and has been authorized for detection and/or diagnosis of SARS-CoV-2 by FDA under an Emergency Use Authorization (EUA). This EUA will remain in effect (meaning this test can be used) for the duration of the COVID-19 declaration under Section 564(b)(1) of the Act, 21 U.S.C. section 360bbb-3(b)(1), unless the authorization is terminated or revoked.  Performed at [facility], [address] [place], [place] [address]        Radiology Studies: CT ABDOMEN PELVIS W CONTRAST  Result Date: 10/05/2020 CLINICAL DATA:  Right lower quadrant abdominal pain and nausea x4 days. EXAM: CT ABDOMEN AND PELVIS WITH CONTRAST TECHNIQUE: Multidetector CT imaging of the abdomen and pelvis was performed using the standard protocol following bolus administration of intravenous contrast. CONTRAST:  OMNIPAQUE IOHEXOL 300 MG/ML  SOLN COMPARISON:  None. FINDINGS: Lower chest: No acute abnormality. Normal size heart. No significant pericardial effusion/thickening. Hepatobiliary: No suspicious hepatic lesion. Gallbladder is unremarkable. No biliary ductal dilation. Pancreas: Within normal limits. Spleen: Within normal limits. Adrenals/Urinary Tract: Adrenal glands are unremarkable. Kidneys are normal, without renal calculi, solid enhancing lesion, or hydronephrosis. Bladder is unremarkable for degree of distension. Stomach/Bowel: Small hiatal hernia otherwise stomach is grossly unremarkable. No pathologic dilation of small bowel. Descending and sigmoid colon are predominantly  decompressed limiting evaluation. Small volume of formed stool in the ascending colon with gaseous distension of the transverse colon. Acute inflammation of a fluid-filled enlarged appendix with a 6 mm appendicoliths. Frayed appearance of the tip of the appendix with right lower quadrant free fluid. Vascular/Lymphatic: No abdominal aortic aneurysm. Prominent right lower quadrant lymph nodes without adenopathy by size criteria. No pathologically enlarged abdominal or pelvic lymph nodes. Reproductive: Mild prostatic enlargement. Other: Mesenteric edema with free fluid in the right lower quadrant and pelvis. There are 2 areas of fluid which appear more organized than with expected for normal free fluid for instance along the cecum measuring 3.4 cm on image 64/2  and along a loop of ileum in the right hemipelvis measuring 2.2 cm on image 75/2, possibly representing organizing collections. No pneumoperitoneum. Musculoskeletal: No acute osseous abnormality. IMPRESSION: Acute appendicitis with possible perforation. Right lower quadrant pelvic free fluid with 2 areas of fluid which appear more organized than with expected in the right lower quadrant and pelvis, possibly representing developing phlegmonous collections. No pneumoperitoneum. Electronically Signed   By: Maudry MayhewJeffrey  Waltz MD   On: 10/05/2020 19:53      Scheduled Meds:  enoxaparin (LOVENOX) injection  40 mg Subcutaneous Q24H   methimazole  10 mg Oral Daily   Continuous Infusions:  sodium chloride Stopped (10/07/20 1015)   piperacillin-tazobactam (ZOSYN)  IV 3.375 g (10/07/20 0615)     LOS: 2 days      Time spent: 25 minutes   Noralee StainJennifer Steward Sames, DO [facility] 10/07/2020, 10:59 AM   Available via Epic secure chat 7am-7pm After these hours, please refer to coverage provider listed on [email or web address]

## 2020-10-07 NOTE — Progress Notes (Signed)
Nsg Discharge Note  Admit Date:  10/05/2020 Discharge date: 10/07/2020   Omar [name] to be D/C'd Home per MD order.  AVS completed.  Copy for chart, and copy for patient signed, and dated. Patient/caregiver able to verbalize understanding.  Discharge Medication: Allergies as of 10/07/2020   No Known Allergies      Medication List     TAKE these medications    bismuth subsalicylate 262 MG/15ML suspension Commonly known as: PEPTO BISMOL Take 30 mLs by mouth 3 (three) times daily.   dicyclomine 20 MG tablet Commonly known as: BENTYL Take 20 mg by mouth 4 (four) times daily as needed (abdominal cramps).   HYDROcodone-acetaminophen 5-325 MG tablet Commonly known as: Norco Take 1 tablet by mouth every 4 (four) hours as needed for moderate pain.   loratadine 10 MG tablet Commonly known as: CLARITIN Take by mouth.   methimazole 10 MG tablet Commonly known as: TAPAZOLE Take 1 tablet by mouth daily.   ondansetron 8 MG disintegrating tablet Commonly known as: ZOFRAN-ODT Take by mouth.        Discharge Assessment: Vitals:   10/07/20 0158 10/07/20 0604  BP: 118/74 131/80  Pulse: 89 94  Resp: 19 19  Temp: 98.6 F (37 C) 98.8 F (37.1 C)  SpO2: 95% 95%   Skin clean, dry and intact without evidence of skin break down, no evidence of skin tears noted. IV catheter discontinued intact. Site without signs and symptoms of complications - no redness or edema noted at insertion site, patient denies c/o pain - only slight tenderness at site.  Dressing with slight pressure applied.  D/c Instructions-Education: Discharge instructions given to patient/family with verbalized understanding. D/c education completed with patient/family including follow up instructions, medication list, d/c activities limitations if indicated, with other d/c instructions as indicated by MD - patient able to verbalize understanding, all questions fully answered. Patient instructed to return to ED, call  911, or call MD for any changes in condition.  Patient escorted via WC, and D/C home via private auto.  Carole Civil, RN 10/07/2020 10:45 AM

## 2020-10-07 NOTE — Plan of Care (Signed)

## 2020-10-08 LAB — SURGICAL PATHOLOGY

## 2020-10-09 ENCOUNTER — Telehealth (INDEPENDENT_AMBULATORY_CARE_PROVIDER_SITE_OTHER): Payer: Self-pay | Admitting: General Surgery

## 2020-10-09 DIAGNOSIS — K352 Acute appendicitis with generalized peritonitis, without abscess: Secondary | ICD-10-CM

## 2020-10-09 NOTE — Telephone Encounter (Signed)
[facility]  Patient called hospital and I returned call. Wife reports him having multiple episodes of diarrhea and no appetite. Still with some pain in the lower abdomen. Clear drainage from lower incision. He is drinking. Having some chills. Fever yesterday but nothing today. Says fever was 100.1  He did have appendicitis with OR report of minimal perforation. Pathology with perforation noted as well. Does not look like he was sent on antibiotics given the minimal perforation based on the dc summary.  Told them he is at risk for abscess formation. Told them to keep hydrated, appetite will return, told them to monitor for fever and if over 101 to go to the ED or if worsening pain or unable to tolerate liquids.  The diarrhea should self correct and is likely from the infection and antibiotics.  They can try some probiotic as desired.  If worsening they will take him to Ed and will need to get full set labs and CT.  Algis Greenhouse, MD [facility] [address] [place], [place] [address] [phone] (office)

## 2020-10-19 ENCOUNTER — Telehealth (INDEPENDENT_AMBULATORY_CARE_PROVIDER_SITE_OTHER): Payer: Self-pay | Admitting: General Surgery

## 2020-10-19 DIAGNOSIS — Z09 Encounter for follow-up examination after completed treatment for conditions other than malignant neoplasm: Secondary | ICD-10-CM

## 2020-10-19 NOTE — Telephone Encounter (Signed)
Postoperative virtual telephone visit performed with patient's son.  He does speak [place] well.  Patient's primary language is [place].  Patient states that he has been a little constipated, but this is resolving.  His incisional pain has resolved.  He is doing well.  I told him to call the office should any problems arise.  As this was a postoperative visit within the global surgical fee, this was not a billable visit.  Total telephone time was 2 minutes.

## 2022-07-20 IMAGING — CT CT ABD-PELV W/ CM
2 of 5 series · 16 of 46 positions shown, 18 images · IV contrast (Omnipaque or Isovue)
Comparison: None.

CLINICAL DATA: Right lower quadrant abdominal pain and nausea x4
days.

EXAM:
CT ABDOMEN AND PELVIS WITH CONTRAST
TECHNIQUE: Multidetector CT imaging of the abdomen and pelvis was performed
using the standard protocol following bolus administration of
intravenous contrast.
CONTRAST:  100mL OMNIPAQUE IOHEXOL 300 MG/ML  SOLN

[Series 2: axial st · axial · 0.86mm/px · z∈[-920,-476]mm · 13 of 101 slices shown, 15 images]
[im 6/101  soft-tissue]
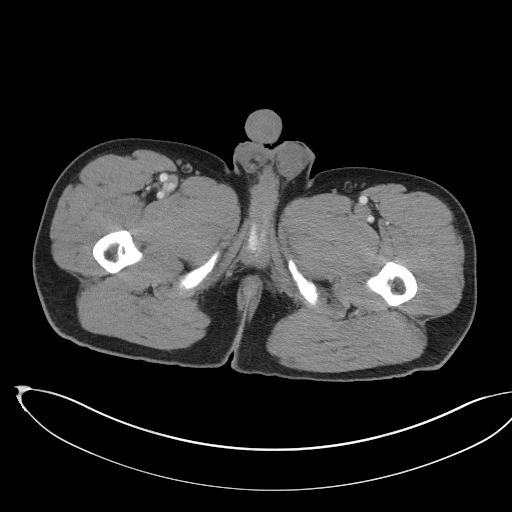
[im 6/101  bone]
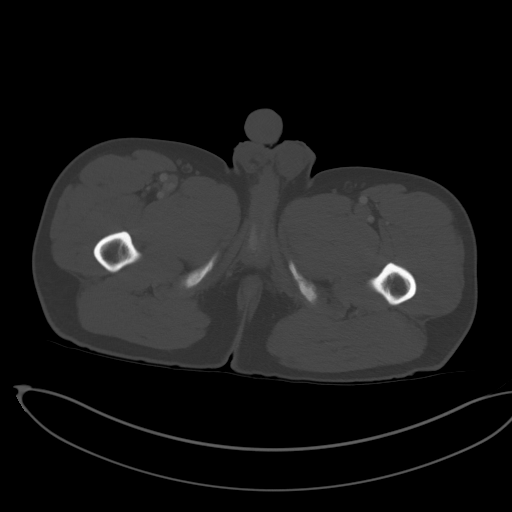
[im 16/101  soft-tissue]
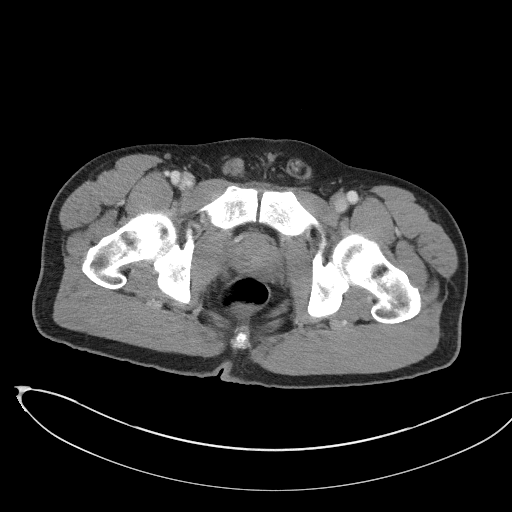
[im 22/101  soft-tissue]
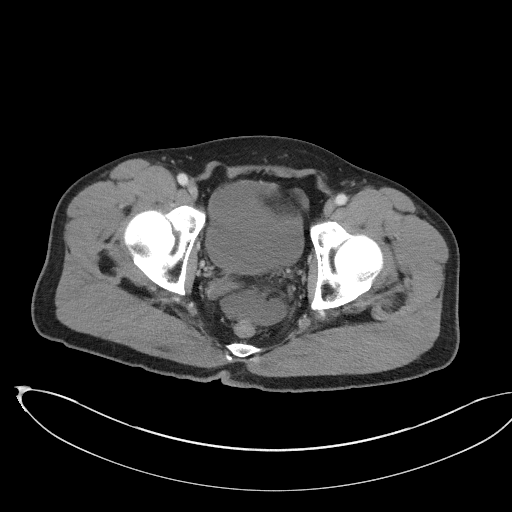
[im 27/101  soft-tissue]
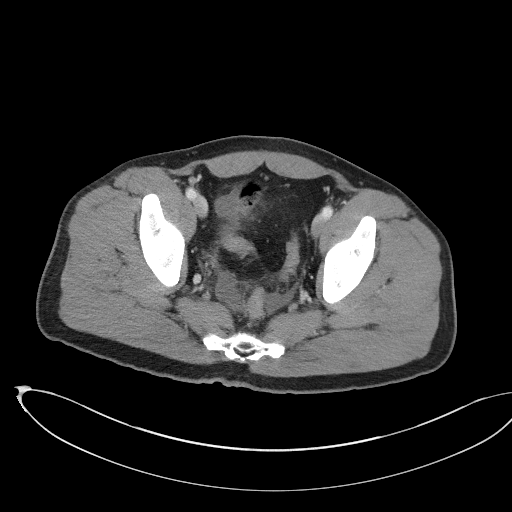
[im 37/101  soft-tissue]
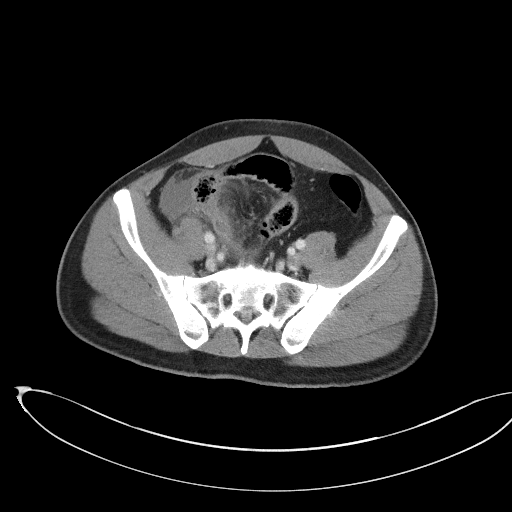
[im 43/101  soft-tissue]
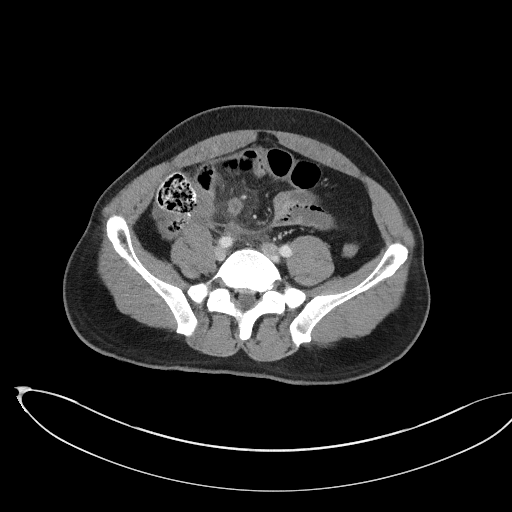
[im 53/101  soft-tissue]
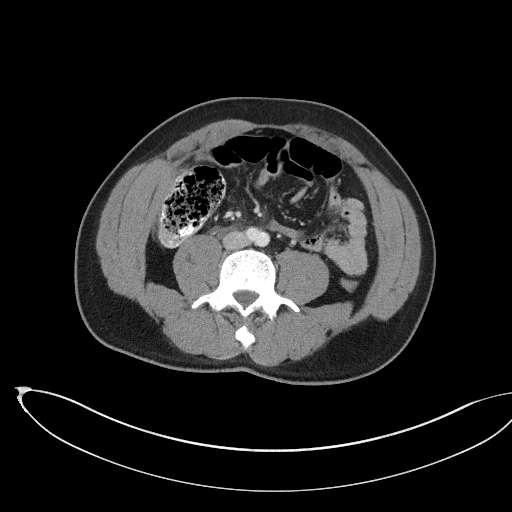
[im 58/101  soft-tissue]
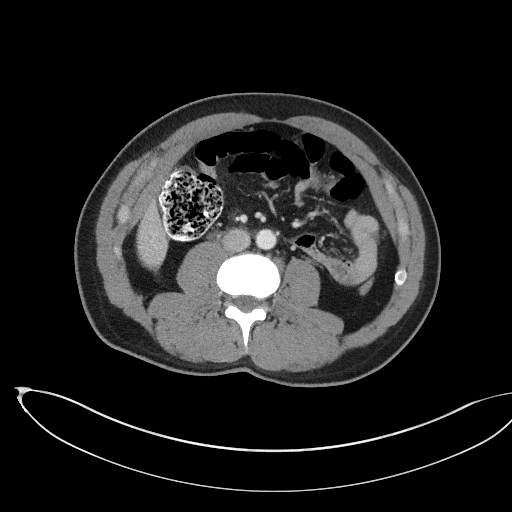
[im 64/101  soft-tissue]
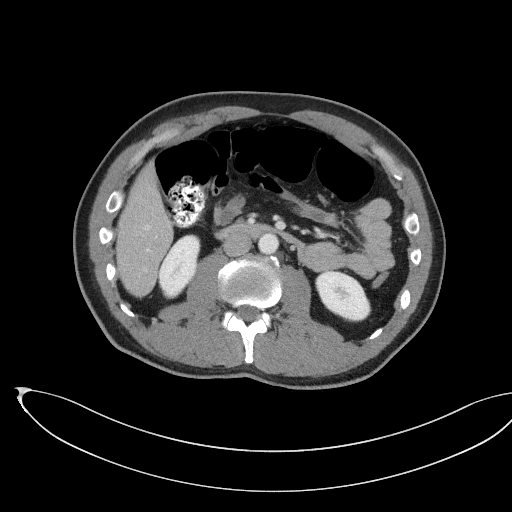
[im 64/101  bone]
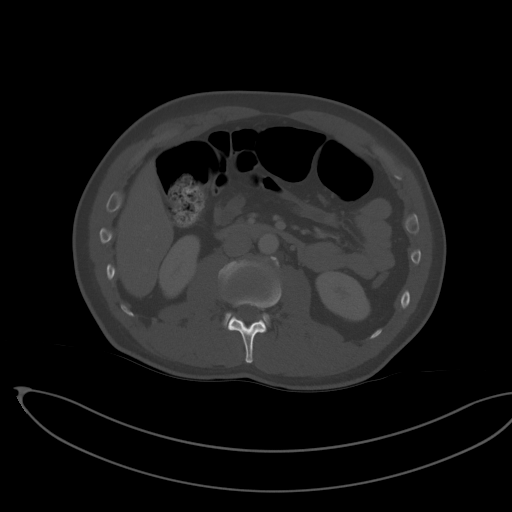
[im 74/101  soft-tissue]
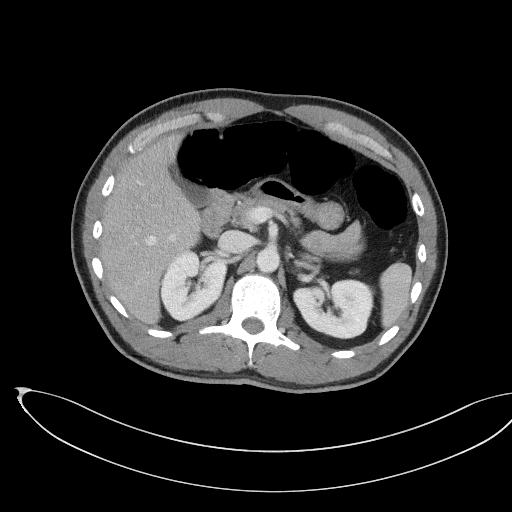
[im 79/101  soft-tissue]
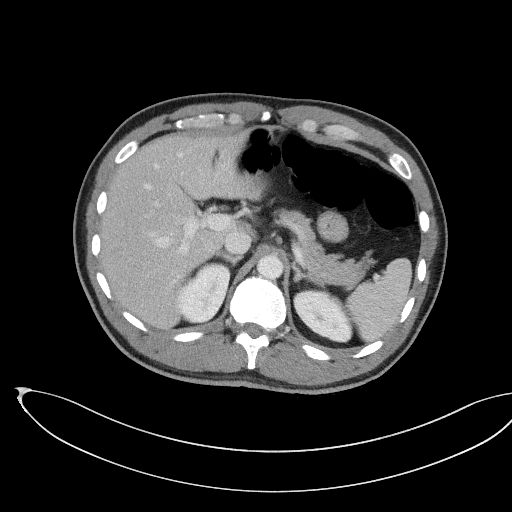
[im 85/101  soft-tissue]
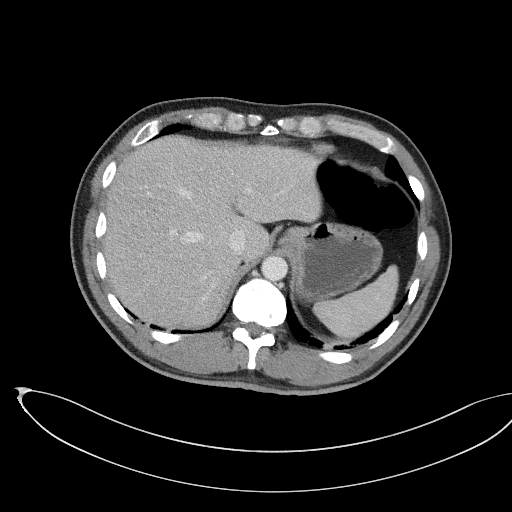
[im 95/101  soft-tissue]
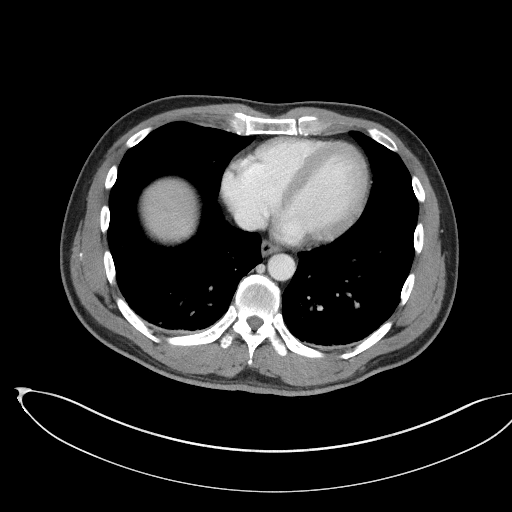

[Series 5: coronal st · coronal · 0.79mm/px · 3 of 104 slices shown]
[im 35/104  soft-tissue]
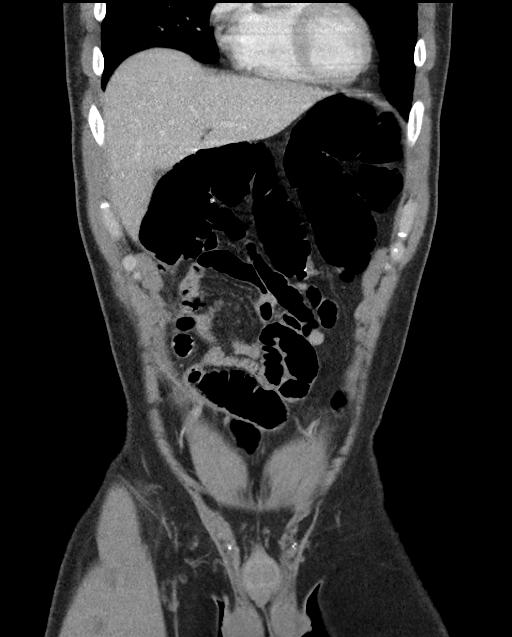
[im 46/104  soft-tissue]
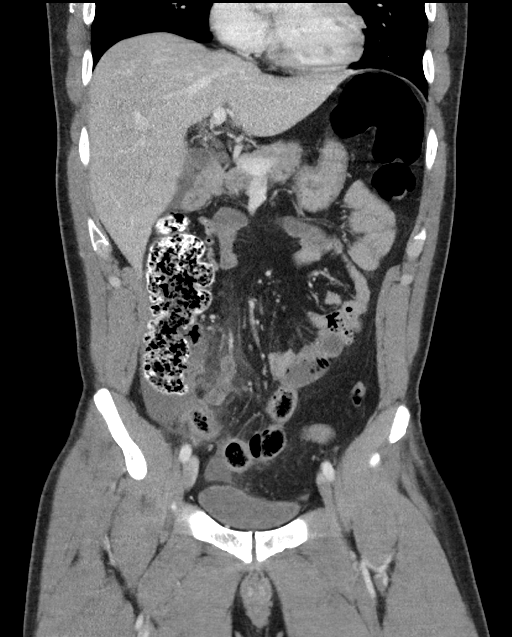
[im 58/104  soft-tissue]
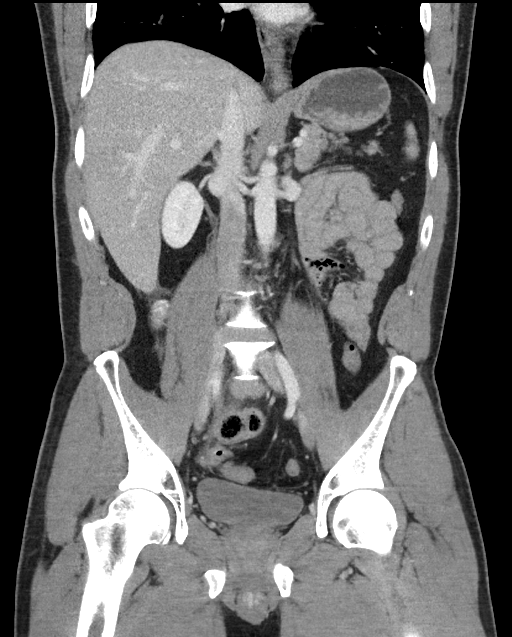

[16 of 46 positions shown; findings below may reference images not displayed]

FINDINGS: Lower chest: No acute abnormality. Normal size heart. No significant
pericardial effusion/thickening.

Hepatobiliary: No suspicious hepatic lesion. Gallbladder is
unremarkable. No biliary ductal dilation.

Pancreas: Within normal limits.

Spleen: Within normal limits.

Adrenals/Urinary Tract: Adrenal glands are unremarkable. Kidneys are
normal, without renal calculi, solid enhancing lesion, or
hydronephrosis. Bladder is unremarkable for degree of distension.

Stomach/Bowel: Small hiatal hernia otherwise stomach is grossly
unremarkable. No pathologic dilation of small bowel. Descending and
sigmoid colon are predominantly decompressed limiting evaluation.
Small volume of formed stool in the ascending colon with gaseous
distension of the transverse colon.

Acute inflammation of a fluid-filled enlarged appendix with a 6 mm
appendicoliths. Frayed appearance of the tip of the appendix with
right lower quadrant free fluid.

Vascular/Lymphatic: No abdominal aortic aneurysm. Prominent right
lower quadrant lymph nodes without adenopathy by size criteria. No
pathologically enlarged abdominal or pelvic lymph nodes.

Reproductive: Mild prostatic enlargement.

Other: Mesenteric edema with free fluid in the right lower quadrant
and pelvis. There are 2 areas of fluid which appear more organized
than with expected for normal free fluid for instance along the
cecum measuring 3.4 cm on image 64/2 and along a loop of ileum in
the right hemipelvis measuring 2.2 cm on image 75/2, possibly
representing organizing collections. No pneumoperitoneum.

Musculoskeletal: No acute osseous abnormality.
IMPRESSION: Acute appendicitis with possible perforation. Right lower quadrant
pelvic free fluid with 2 areas of fluid which appear more organized
than with expected in the right lower quadrant and pelvis, possibly
representing developing phlegmonous collections. No
pneumoperitoneum.
